# Patient Record
Sex: Female | Born: 1950 | Race: White | Hispanic: No | Smoking: Former smoker
Health system: Southern US, Community
[De-identification: ages and names within clinical notes are randomized; demographics above are authoritative.]

## PROBLEM LIST (undated history)

## (undated) DIAGNOSIS — M069 Rheumatoid arthritis, unspecified: Secondary | ICD-10-CM

## (undated) DIAGNOSIS — IMO0002 Reserved for concepts with insufficient information to code with codable children: Secondary | ICD-10-CM

## (undated) DIAGNOSIS — M329 Systemic lupus erythematosus, unspecified: Secondary | ICD-10-CM

## (undated) DIAGNOSIS — Z9981 Dependence on supplemental oxygen: Secondary | ICD-10-CM

## (undated) DIAGNOSIS — I10 Essential (primary) hypertension: Secondary | ICD-10-CM

## (undated) DIAGNOSIS — I7300 Raynaud's syndrome without gangrene: Secondary | ICD-10-CM

## (undated) DIAGNOSIS — I272 Pulmonary hypertension, unspecified: Secondary | ICD-10-CM

## (undated) DIAGNOSIS — K219 Gastro-esophageal reflux disease without esophagitis: Secondary | ICD-10-CM

## (undated) DIAGNOSIS — M359 Systemic involvement of connective tissue, unspecified: Secondary | ICD-10-CM

## (undated) HISTORY — DX: Reserved for concepts with insufficient information to code with codable children: IMO0002

## (undated) HISTORY — DX: Systemic involvement of connective tissue, unspecified: M35.9

## (undated) HISTORY — DX: Pulmonary hypertension, unspecified: I27.20

## (undated) HISTORY — PX: BILATERAL CARPAL TUNNEL RELEASE: SHX6508

## (undated) HISTORY — PX: CATARACT EXTRACTION: SUR2

## (undated) HISTORY — DX: Systemic lupus erythematosus, unspecified: M32.9

## (undated) HISTORY — PX: OTHER SURGICAL HISTORY: SHX169

## (undated) HISTORY — DX: Raynaud's syndrome without gangrene: I73.00

## (undated) HISTORY — DX: Rheumatoid arthritis, unspecified: M06.9

## (undated) HISTORY — PX: HERNIA REPAIR: SHX51

---

## 2014-09-18 ENCOUNTER — Encounter (INDEPENDENT_AMBULATORY_CARE_PROVIDER_SITE_OTHER): Payer: Self-pay | Admitting: *Deleted

## 2014-09-27 ENCOUNTER — Encounter (INDEPENDENT_AMBULATORY_CARE_PROVIDER_SITE_OTHER): Payer: Self-pay | Admitting: Internal Medicine

## 2014-09-27 ENCOUNTER — Ambulatory Visit (INDEPENDENT_AMBULATORY_CARE_PROVIDER_SITE_OTHER): Payer: Medicare Other | Admitting: Internal Medicine

## 2014-09-27 ENCOUNTER — Encounter (INDEPENDENT_AMBULATORY_CARE_PROVIDER_SITE_OTHER): Payer: Self-pay | Admitting: *Deleted

## 2014-09-27 ENCOUNTER — Other Ambulatory Visit (INDEPENDENT_AMBULATORY_CARE_PROVIDER_SITE_OTHER): Payer: Self-pay | Admitting: Internal Medicine

## 2014-09-27 VITALS — BP 102/58 | HR 72 | Temp 98.0°F | Ht 62.0 in | Wt 127.5 lb

## 2014-09-27 DIAGNOSIS — M329 Systemic lupus erythematosus, unspecified: Secondary | ICD-10-CM | POA: Diagnosis not present

## 2014-09-27 DIAGNOSIS — J849 Interstitial pulmonary disease, unspecified: Secondary | ICD-10-CM

## 2014-09-27 DIAGNOSIS — M069 Rheumatoid arthritis, unspecified: Secondary | ICD-10-CM | POA: Diagnosis not present

## 2014-09-27 DIAGNOSIS — R1314 Dysphagia, pharyngoesophageal phase: Secondary | ICD-10-CM | POA: Diagnosis not present

## 2014-09-27 DIAGNOSIS — I272 Pulmonary hypertension, unspecified: Secondary | ICD-10-CM | POA: Insufficient documentation

## 2014-09-27 DIAGNOSIS — M359 Systemic involvement of connective tissue, unspecified: Secondary | ICD-10-CM | POA: Diagnosis not present

## 2014-09-27 DIAGNOSIS — I7300 Raynaud's syndrome without gangrene: Secondary | ICD-10-CM | POA: Insufficient documentation

## 2014-09-27 DIAGNOSIS — I270 Primary pulmonary hypertension: Secondary | ICD-10-CM

## 2014-09-27 NOTE — Progress Notes (Addendum)
   Subjective:    Patient ID: Jessica Escobar, female    DOB: 1950/11/25, 64 y.o.   MRN: [id number]  HPI Referred to our our office by Bunnie Philips of [facility] for dysphagia. She tells me she is having trouble swallowing. She say foods are lodging in her mid-esophagus. It makes her feel like she needs to vomit. [place] will lodge. [place] sandwich will lodge. Symptoms x 6 months. Occurs on a daily basis.  Appetite is good. No weight loss. No abdominal pain. She has a BM daily. No melena or BRRB.  Hx of Lupus, connective tissue disease, Raynaud's disease. She has acid reflux which is controlled with Zantac.  Colonoscopy 03/17/2005 Dr. Aleene Davidson: Normal colonoscopy. Review of Systems Past Medical History  Diagnosis Date  . RA (rheumatoid arthritis)   . Lupus   . Raynaud disease   . Pulmonary hypertension   . Connective tissue disease     Past Surgical History  Procedure Laterality Date  . Broken femur rod    . Cataract extraction      bilateral  . Bilateral carpal tunnel release      Allergies  Allergen Reactions  . Daypro [Oxaprozin]     Upset stomach  . Penicillins     Rash,hives    No current outpatient prescriptions on file prior to visit.   No current facility-administered medications on file prior to visit.        Objective:   Physical ExamBlood pressure 102/58, pulse 72, temperature 98 F (36.7 C), height 5\' 2"  (1.575 m), weight 127 lb 8 oz (57.834 kg).  Alert and oriented. Skin warm and dry. Oral mucosa is moist.   . Sclera anicteric, conjunctivae is pink. Thyroid not enlarged. No cervical lymphadenopathy. Lungs clear. Heart regular rate and rhythm.  Abdomen is soft. Bowel sounds are positive. No hepatomegaly. No abdominal masses felt. No tenderness.  No edema to lower extremities.         Assessment & Plan:  Dysphagia to solid foods. EGD/ED.  The risks and benefits such as perforation, bleeding, and infection were reviewed with the  patient and is agreeable.

## 2014-09-27 NOTE — Patient Instructions (Signed)
EGD/ED 

## 2014-10-06 ENCOUNTER — Ambulatory Visit (INDEPENDENT_AMBULATORY_CARE_PROVIDER_SITE_OTHER): Payer: Self-pay | Admitting: Internal Medicine

## 2014-10-18 ENCOUNTER — Encounter (INDEPENDENT_AMBULATORY_CARE_PROVIDER_SITE_OTHER): Payer: Self-pay

## 2014-10-20 ENCOUNTER — Encounter (HOSPITAL_COMMUNITY): Payer: Self-pay | Admitting: *Deleted

## 2014-10-20 ENCOUNTER — Ambulatory Visit (HOSPITAL_COMMUNITY)
Admission: RE | Admit: 2014-10-20 | Discharge: 2014-10-20 | Disposition: A | Discharge status: Home / Self Care | Payer: Medicare Other | Source: Ambulatory Visit | Attending: Internal Medicine | Admitting: Internal Medicine

## 2014-10-20 ENCOUNTER — Encounter (HOSPITAL_COMMUNITY)
Admission: RE | Disposition: A | Discharge status: Home / Self Care | Payer: Self-pay | Source: Ambulatory Visit | Attending: Internal Medicine

## 2014-10-20 DIAGNOSIS — K219 Gastro-esophageal reflux disease without esophagitis: Secondary | ICD-10-CM | POA: Insufficient documentation

## 2014-10-20 DIAGNOSIS — K2210 Ulcer of esophagus without bleeding: Secondary | ICD-10-CM | POA: Diagnosis not present

## 2014-10-20 DIAGNOSIS — M069 Rheumatoid arthritis, unspecified: Secondary | ICD-10-CM | POA: Diagnosis not present

## 2014-10-20 DIAGNOSIS — Z791 Long term (current) use of non-steroidal anti-inflammatories (NSAID): Secondary | ICD-10-CM | POA: Insufficient documentation

## 2014-10-20 DIAGNOSIS — I272 Other secondary pulmonary hypertension: Secondary | ICD-10-CM | POA: Insufficient documentation

## 2014-10-20 DIAGNOSIS — K449 Diaphragmatic hernia without obstruction or gangrene: Secondary | ICD-10-CM | POA: Diagnosis not present

## 2014-10-20 DIAGNOSIS — R1314 Dysphagia, pharyngoesophageal phase: Secondary | ICD-10-CM

## 2014-10-20 DIAGNOSIS — K208 Other esophagitis: Secondary | ICD-10-CM | POA: Diagnosis not present

## 2014-10-20 DIAGNOSIS — R1310 Dysphagia, unspecified: Secondary | ICD-10-CM | POA: Insufficient documentation

## 2014-10-20 DIAGNOSIS — K222 Esophageal obstruction: Secondary | ICD-10-CM | POA: Diagnosis not present

## 2014-10-20 DIAGNOSIS — M329 Systemic lupus erythematosus, unspecified: Secondary | ICD-10-CM | POA: Insufficient documentation

## 2014-10-20 DIAGNOSIS — Z79899 Other long term (current) drug therapy: Secondary | ICD-10-CM | POA: Diagnosis not present

## 2014-10-20 HISTORY — PX: ESOPHAGOGASTRODUODENOSCOPY: SHX5428

## 2014-10-20 SURGERY — EGD (ESOPHAGOGASTRODUODENOSCOPY)
Anesthesia: Moderate Sedation

## 2014-10-20 MED ORDER — MEPERIDINE HCL 50 MG/ML IJ SOLN
INTRAMUSCULAR | Status: AC
  Filled 2014-10-20: qty 1

## 2014-10-20 MED ORDER — MIDAZOLAM HCL 5 MG/5ML IJ SOLN
INTRAMUSCULAR | Status: AC
  Filled 2014-10-20: qty 10

## 2014-10-20 MED ORDER — OMEPRAZOLE 20 MG PO CPDR: DELAYED_RELEASE_CAPSULE | ORAL | Status: DC

## 2014-10-20 MED ORDER — MEPERIDINE HCL 50 MG/ML IJ SOLN
INTRAMUSCULAR | Status: DC | PRN
  Administered 2014-10-20 (×2): 25 mg via INTRAVENOUS

## 2014-10-20 MED ORDER — BUTAMBEN-TETRACAINE-BENZOCAINE 2-2-14 % EX AERO
INHALATION_SPRAY | CUTANEOUS | Status: DC | PRN
  Administered 2014-10-20: 2 via TOPICAL

## 2014-10-20 MED ORDER — SIMETHICONE 40 MG/0.6ML PO SUSP
ORAL | Status: DC | PRN
  Administered 2014-10-20: 14:00:00

## 2014-10-20 MED ORDER — MIDAZOLAM HCL 5 MG/5ML IJ SOLN
INTRAMUSCULAR | Status: DC | PRN
  Administered 2014-10-20 (×2): 1 mg via INTRAVENOUS
  Administered 2014-10-20 (×3): 2 mg via INTRAVENOUS

## 2014-10-20 MED ORDER — SODIUM CHLORIDE 0.9 % IV SOLN
INTRAVENOUS | Status: DC
  Administered 2014-10-20: 1000 mL via INTRAVENOUS

## 2014-10-20 NOTE — Discharge Instructions (Signed)
Discontinue Zantac but resume other medications as before. Omeprazole 20 mg by mouth 30before breakfast and evening meal daily. No driving for 24 hours. Office visit in 8 weeks.        Esophagogastroduodenoscopy Care After Refer to this sheet in the next few weeks. These instructions provide you with information on caring for yourself after your procedure. Your caregiver may also give you more specific instructions. Your treatment has been planned according to current medical practices, but problems sometimes occur. Call your caregiver if you have any problems or questions after your procedure.  HOME CARE INSTRUCTIONS  Do not eat or drink anything until the numbing medicine (local anesthetic) has worn off and your gag reflex has returned. You will know that the local anesthetic has worn off when you can swallow comfortably.  Do not drive for 12 hours after the procedure or as directed by your caregiver.  Only take medicines as directed by your caregiver. SEEK MEDICAL CARE IF:   You cannot stop coughing.  You are not urinating at all or less than usual. SEEK IMMEDIATE MEDICAL CARE IF:  You have difficulty swallowing.  You cannot eat or drink.  You have worsening throat or chest pain.  You have dizziness, lightheadedness, or you faint.  You have nausea or vomiting.  You have chills.  You have a fever.  You have severe abdominal pain.  You have black, tarry, or bloody stools. Document Released: 12/31/2011 Document Reviewed: 12/31/2011 [facility] Patient Information 2015 [place], [place]. This information is not intended to replace advice given to you by your health care provider. Make sure you discuss any questions you have with your health care provider.

## 2014-10-20 NOTE — Op Note (Signed)
**Note De-Identified Alahna Dunne Obfuscation** EGD PROCEDURE REPORT  PATIENT:  Jessica Escobar  MR#:  [id number] Birthdate:  [birth year], 64 y.o., female Endoscopist:  Dr. [name], MD Referred By:  Dr. Renaldo Harrison, DO  Procedure Date: 10/20/2014  Procedure:   EGD(ED not performed)  Indications:  Patient is 64 year old Caucasian female with chronic GERD who also has rheumatoid arthritis and lupus who presents with three-month history of dysphagia and odynophagia. She states swallowing difficulty started after she was switched from PPI to Zantac because of potential side effects.            Informed Consent:  The risks, benefits, alternatives & imponderables which include, but are not limited to, bleeding, infection, perforation, drug reaction and potential missed lesion have been reviewed.  The potential for biopsy, lesion removal, esophageal dilation, etc. have also been discussed.  Questions have been answered.  All parties agreeable.  Please see history & physical in medical record for more information.  Medications:  Demerol 50 mg IV Versed 8 mg IV Cetacaine spray topically for oropharyngeal anesthesia  Description of procedure:  The endoscope was introduced through the mouth and advanced to the second portion of the duodenum without difficulty or limitations. The mucosal surfaces were surveyed very carefully during advancement of the scope and upon withdrawal.  Findings:  Esophagus:  Mucosa of the proximal segment was normal. For long linear ulcers noted involving distal 8 cm of esophagus extending to GE junction. Soft stricture noted at GE junction with friable mucosa. GEJ:  32 cm Hiatus:  35 cm Stomach:  Stomach was empty and distended very well with insufflation. Folds in the proximal stomach were normal. Examination mucosa gastric body, antrum, pyloric channel, angularis fundus and cardia was normal. Duodenum:  Normal bulbar and post bulbar mucosa.  Therapeutic/Diagnostic Maneuvers Performed:   Esophagus was not dilated  given findings.  Complications:  None  EBL: None  Impression: Extensive ulceration involving distal 8 cm of esophageal mucosa. 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 Small sliding hiatal hernia. No evidence of peptic ulcer disease or gastritis.   Recommendations:  Continue anti-reflux measures. Discontinue Zantac. Omeprazole 20 mg by mouth twice a day. Office visit in 8 weeks.  REHMAN,NAJEEB U  10/20/2014  2:44 PM  CC: Dr. Renaldo Harrison, DO & Dr. No ref. provider found

## 2014-10-20 NOTE — H&P (Signed)
Jessica Escobar is an 64 y.o. female.   Chief Complaint: Patient is here for EGD and possible ED. HPI: Patient is 64 year old Caucasian female with history of rheumatoid arthritis lupus as well as GERD who presents with three-month history of dysphagia and odynophagia. She says symptoms began when she was switched from omeprazole to Zantac only. Since then she's been having intermittent heartburn. She points to upper sternum area site of bolus obstruction. She denies nausea vomiting melena or rectal bleeding abdominal pain. She is on Naprosyn 220 mg 3 times a day. Her appetite is normal and she has not lost any weight. She had EGD about 10 years ago and was normal.  Past Medical History  Diagnosis Date  . RA (rheumatoid arthritis)   . Lupus   . Raynaud disease   . Pulmonary hypertension   . Connective tissue disease     Past Surgical History  Procedure Laterality Date  . Broken femur rod    . Cataract extraction      bilateral  . Bilateral carpal tunnel release      History reviewed. No pertinent family history. Social History:  reports that she has never smoked. She does not have any smokeless tobacco history on file. She reports that she does not drink alcohol or use illicit drugs.  Allergies:  Allergies  Allergen Reactions  . Daypro [Oxaprozin]     Upset stomach  . Penicillins     Rash,hives    Medications Prior to Admission  Medication Sig Dispense Refill  . amLODipine (NORVASC) 2.5 MG tablet Take 2.5 mg by mouth daily.    . Ascorbic Acid (VITAMIN C) 100 MG tablet Take 100 mg by mouth daily.    . calcium carbonate (OS-CAL) 600 MG TABS tablet Take 600 mg by mouth 3 (three) times daily with meals. With D    . fexofenadine (ALLEGRA) 180 MG tablet Take 180 mg by mouth daily.    Marland Kitchen Fish Oil-Cholecalciferol (FISH OIL + D3 PO) Take 1,200 mg by mouth 3 (three) times daily.     . hydroxychloroquine (PLAQUENIL) 200 MG tablet Take by mouth daily. And one every other night    .  lactobacillus acidophilus (BACID) TABS tablet Take 1 tablet by mouth every evening.     . naproxen sodium (ANAPROX) 220 MG tablet Take 220-440 mg by mouth See admin instructions. Two in am and one at night    . pravastatin (PRAVACHOL) 80 MG tablet Take 80 mg by mouth daily.    . ranitidine (ZANTAC) 150 MG tablet Take 150 mg by mouth 2 (two) times daily.    . Tadalafil, PAH, (ADCIRCA) 20 MG TABS Take 40 mg by mouth daily.     . Vitamin D, Ergocalciferol, (DRISDOL) 50000 UNITS CAPS capsule Take 50,000 Units by mouth every 7 (seven) days. friday      No results found for this or any previous visit (from the past 48 hour(s)). No results found.  ROS  Blood pressure 150/82, pulse 74, temperature 98.6 F (37 C), temperature source Oral, resp. rate 21, height 5' 2.5" (1.588 m), weight 129 lb (58.514 kg), SpO2 99 %. Physical Exam  Constitutional: She appears well-developed and well-nourished.  HENT:  Mouth/Throat: Oropharynx is clear and moist.  Patient has upper and lower dentures.  Eyes: Conjunctivae are normal. No scleral icterus.  Neck: No thyromegaly present.  Cardiovascular: Normal rate, regular rhythm and normal heart sounds.   No murmur heard. Respiratory: Effort normal and breath sounds normal.  GI: Soft.  She exhibits no distension and no mass. There is no tenderness.  Musculoskeletal: She exhibits no edema.  Lymphadenopathy:    She has no cervical adenopathy.  Neurological: She is alert.  Skin: Skin is warm and dry.     Assessment/Plan Dysphagia and odynophagia in a patient with chronic GERD. History of connective tissue disorder and esophageal dysmotility suspected. EGD possible ED.  REHMAN,NAJEEB U 10/20/2014, 2:21 PM

## 2014-10-23 ENCOUNTER — Telehealth (INDEPENDENT_AMBULATORY_CARE_PROVIDER_SITE_OTHER): Payer: Self-pay | Admitting: *Deleted

## 2014-10-23 NOTE — Telephone Encounter (Signed)
Per EGD op note, patient needs OV 8 weeks

## 2014-10-23 NOTE — Telephone Encounter (Signed)
Apt has been scheduled for 12/19/14 with Dorene Ar, NP.

## 2014-10-25 ENCOUNTER — Encounter (HOSPITAL_COMMUNITY): Payer: Self-pay | Admitting: Internal Medicine

## 2014-12-19 ENCOUNTER — Encounter (INDEPENDENT_AMBULATORY_CARE_PROVIDER_SITE_OTHER): Payer: Self-pay | Admitting: Internal Medicine

## 2014-12-19 ENCOUNTER — Ambulatory Visit (INDEPENDENT_AMBULATORY_CARE_PROVIDER_SITE_OTHER): Payer: Medicare Other | Admitting: Internal Medicine

## 2014-12-19 VITALS — BP 116/72 | HR 76 | Temp 98.0°F | Ht 62.5 in | Wt 139.8 lb

## 2014-12-19 DIAGNOSIS — K209 Esophagitis, unspecified without bleeding: Secondary | ICD-10-CM

## 2014-12-19 DIAGNOSIS — Z1211 Encounter for screening for malignant neoplasm of colon: Secondary | ICD-10-CM | POA: Diagnosis not present

## 2014-12-19 NOTE — Progress Notes (Signed)
Subjective:    Patient ID: Jessica Escobar, female    DOB: 07-16-1950, 64 y.o.   MRN: [id number]  HPI She tells me he is doing well. She is taking Omeprazole 20mg  BID. There is no dysphagia. She tells me she ate a cheese burger last night without any trouble. Her appetite is good. She has gained about 3 pounds since her last visit. No acid reflux.  Controlled with Omeprazole.  She usually has a BM.  No melena or BRRB.      10/20/2014 EGD(ED not performed)  Indications: Patient is 64 year old Caucasian female with chronic GERD who also has rheumatoid arthritis and lupus who presents with three-month history of dysphagia and odynophagia. She states swallowing difficulty started after she was switched from PPI to Zantac because of potential side effects.  Impression: Extensive ulceration involving distal 8 cm of esophageal mucosa. 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 Small sliding hiatal hernia. No evidence of peptic ulcer disease or gastritis. She has acid reflux which is controlled with Zantac.    Colonoscopy 03/17/2005 Dr. 03/19/2005: Normal colonoscopy.    Review of Systems Past Medical History  Diagnosis Date  . RA (rheumatoid arthritis)   . Lupus   . Raynaud disease   . Pulmonary hypertension   . Connective tissue disease     Past Surgical History  Procedure Laterality Date  . Broken femur rod    . Cataract extraction      bilateral  . Bilateral carpal tunnel release    . Esophagogastroduodenoscopy N/A 10/20/2014    Procedure: ESOPHAGOGASTRODUODENOSCOPY (EGD);  Surgeon: 10/22/2014, MD;  Location: [facility];  Service: Endoscopy;  Laterality: N/A;  210    Allergies  Allergen Reactions  . Daypro [Oxaprozin]     Upset stomach  . Penicillins     Rash,hives    Current Outpatient  Prescriptions on File Prior to Visit  Medication Sig Dispense Refill  . amLODipine (NORVASC) 2.5 MG tablet Take 2.5 mg by mouth daily.    . Ascorbic Acid (VITAMIN C) 100 MG tablet Take 100 mg by mouth daily.    . calcium carbonate (OS-CAL) 600 MG TABS tablet Take 600 mg by mouth 3 (three) times daily with meals. With D    . fexofenadine (ALLEGRA) 180 MG tablet Take 180 mg by mouth daily.    [name] Fish Oil-Cholecalciferol (FISH OIL + D3 PO) Take 1,200 mg by mouth 3 (three) times daily.     . hydroxychloroquine (PLAQUENIL) 200 MG tablet Take by mouth daily. And one every other night    . lactobacillus acidophilus (BACID) TABS tablet Take 1 tablet by mouth every evening.     . naproxen sodium (ANAPROX) 220 MG tablet Take 220-440 mg by mouth See admin instructions. Two in am and one at night    . omeprazole (PRILOSEC) 20 MG capsule Take 1 capsule by mouth 30 minutes before breakfast and evening meal daily 60 capsule 5  . pravastatin (PRAVACHOL) 80 MG tablet Take 80 mg by mouth daily.    . Tadalafil, PAH, (ADCIRCA) 20 MG TABS Take 40 mg by mouth daily.     . Vitamin D, Ergocalciferol, (DRISDOL) 50000 UNITS CAPS capsule Take 50,000 Units by mouth every 7 (seven) days. friday     No current facility-administered medications on file prior to visit.       Objective:   Physical Exam Blood pressure 116/72, pulse 76, temperature 98 F (36.7 C),  height 5' 2.5" (1.588 m), weight 139 lb 12.8 oz (63.413 kg).  Alert and oriented. Skin warm and dry. Oral mucosa is moist.   . Sclera anicteric, conjunctivae is pink. Thyroid not enlarged. No cervical lymphadenopathy. Lungs clear. Heart regular rate and rhythm.  Abdomen is soft. Bowel sounds are positive. No hepatomegaly. No abdominal masses felt. No tenderness.  No edema to lower extremities.      Assessment & Plan:  Esophageal ulceration on EGD in September. Soft stricture. She is 100% better at this time. Continue the Omeprazole. Recall for colonoscopy in  February of 2017.

## 2014-12-19 NOTE — Patient Instructions (Signed)
Recall for colonoscopy in February.

## 2015-06-06 ENCOUNTER — Other Ambulatory Visit (INDEPENDENT_AMBULATORY_CARE_PROVIDER_SITE_OTHER): Payer: Self-pay | Admitting: *Deleted

## 2015-06-06 DIAGNOSIS — Z1211 Encounter for screening for malignant neoplasm of colon: Secondary | ICD-10-CM

## 2015-06-18 ENCOUNTER — Other Ambulatory Visit (INDEPENDENT_AMBULATORY_CARE_PROVIDER_SITE_OTHER): Payer: Self-pay | Admitting: Internal Medicine

## 2015-07-30 ENCOUNTER — Other Ambulatory Visit (INDEPENDENT_AMBULATORY_CARE_PROVIDER_SITE_OTHER): Payer: Self-pay | Admitting: *Deleted

## 2015-07-30 ENCOUNTER — Encounter (INDEPENDENT_AMBULATORY_CARE_PROVIDER_SITE_OTHER): Payer: Self-pay | Admitting: *Deleted

## 2015-07-30 ENCOUNTER — Telehealth (INDEPENDENT_AMBULATORY_CARE_PROVIDER_SITE_OTHER): Payer: Self-pay | Admitting: *Deleted

## 2015-07-30 NOTE — Telephone Encounter (Signed)
Referring MD/PCP: addis   Procedure: tcs  Reason/Indication:  screening  Has patient had this procedure before?  Yes, 2007 -- scanned  If so, when, by whom and where?    Is there a family history of colon cancer?  no  Who?  What age when diagnosed?    Is patient diabetic?   no      Does patient have prosthetic heart valve or mechanical valve?  no  Do you have a pacemaker?  no  Has patient ever had endocarditis? No but had pericarditis when she was 22, per Dr  Karilyn Cota no antibx needed  Has patient had joint replacement within last 12 months?  no  Does patient tend to be constipated or take laxatives? no  Does patient have a history of alcohol/drug use?  no  Is patient on Coumadin, Plavix and/or Aspirin? no  Medications: see epic  Allergies: see epic  Medication Adjustment:   Procedure date & time: 08/29/15 at 1030

## 2015-07-30 NOTE — Telephone Encounter (Signed)
Patient needs trilyte 

## 2015-08-01 NOTE — Telephone Encounter (Signed)
agree

## 2015-08-02 MED ORDER — PEG 3350-KCL-NA BICARB-NACL 420 G PO SOLR: 4000.0000 mL | Freq: Once | ORAL | Status: DC

## 2015-08-29 ENCOUNTER — Encounter (HOSPITAL_COMMUNITY)
Admission: RE | Disposition: A | Discharge status: Home / Self Care | Payer: Self-pay | Source: Ambulatory Visit | Attending: Internal Medicine

## 2015-08-29 ENCOUNTER — Encounter (HOSPITAL_COMMUNITY): Payer: Self-pay | Admitting: *Deleted

## 2015-08-29 ENCOUNTER — Ambulatory Visit (HOSPITAL_COMMUNITY)
Admission: RE | Admit: 2015-08-29 | Discharge: 2015-08-29 | Disposition: A | Discharge status: Home / Self Care | Payer: Medicare Other | Source: Ambulatory Visit | Attending: Internal Medicine | Admitting: Internal Medicine

## 2015-08-29 DIAGNOSIS — M329 Systemic lupus erythematosus, unspecified: Secondary | ICD-10-CM | POA: Diagnosis not present

## 2015-08-29 DIAGNOSIS — Z791 Long term (current) use of non-steroidal anti-inflammatories (NSAID): Secondary | ICD-10-CM | POA: Diagnosis not present

## 2015-08-29 DIAGNOSIS — Z79899 Other long term (current) drug therapy: Secondary | ICD-10-CM | POA: Insufficient documentation

## 2015-08-29 DIAGNOSIS — M069 Rheumatoid arthritis, unspecified: Secondary | ICD-10-CM | POA: Diagnosis not present

## 2015-08-29 DIAGNOSIS — Z87891 Personal history of nicotine dependence: Secondary | ICD-10-CM | POA: Diagnosis not present

## 2015-08-29 DIAGNOSIS — Z1211 Encounter for screening for malignant neoplasm of colon: Secondary | ICD-10-CM | POA: Diagnosis not present

## 2015-08-29 HISTORY — PX: COLONOSCOPY: SHX5424

## 2015-08-29 SURGERY — COLONOSCOPY
Anesthesia: Moderate Sedation

## 2015-08-29 MED ORDER — MIDAZOLAM HCL 5 MG/5ML IJ SOLN
INTRAMUSCULAR | Status: DC | PRN
  Administered 2015-08-29 (×2): 1 mg via INTRAVENOUS
  Administered 2015-08-29 (×2): 2 mg via INTRAVENOUS

## 2015-08-29 MED ORDER — MEPERIDINE HCL 50 MG/ML IJ SOLN
INTRAMUSCULAR | Status: AC
  Filled 2015-08-29: qty 1

## 2015-08-29 MED ORDER — MIDAZOLAM HCL 5 MG/5ML IJ SOLN
INTRAMUSCULAR | Status: DC
Start: 2015-08-29 — End: 2015-08-29
  Filled 2015-08-29: qty 10

## 2015-08-29 MED ORDER — SODIUM CHLORIDE 0.9 % IV SOLN
INTRAVENOUS | Status: DC
  Administered 2015-08-29: 1000 mL via INTRAVENOUS

## 2015-08-29 MED ORDER — STERILE WATER FOR IRRIGATION IR SOLN
Status: DC | PRN
  Administered 2015-08-29: 2.5 mL

## 2015-08-29 MED ORDER — MEPERIDINE HCL 50 MG/ML IJ SOLN
INTRAMUSCULAR | Status: DC | PRN
  Administered 2015-08-29 (×2): 25 mg via INTRAVENOUS

## 2015-08-29 NOTE — H&P (Signed)
Jessica Escobar is an 65 y.o. female.   Chief Complaint: Patient is here for colonoscopy. HPI: Patient is 65 year old Caucasian female who is here for screening colonoscopy. She denies abdominal pain change in bowel habits or rectal bleeding. Last colonoscopy was 10 years ago and revealed internal hemorrhoids. History is negative for CRC.  Past Medical History:  Diagnosis Date  . Connective tissue disease (HCC)   . Lupus (HCC)   . Pulmonary hypertension (HCC)   . RA (rheumatoid arthritis) (HCC)   . Raynaud disease     Past Surgical History:  Procedure Laterality Date  . BILATERAL CARPAL TUNNEL RELEASE    . Broken femur rod    . CATARACT EXTRACTION     bilateral  . ESOPHAGOGASTRODUODENOSCOPY N/A 10/20/2014   Procedure: ESOPHAGOGASTRODUODENOSCOPY (EGD);  Surgeon: [name], MD;  Location: [facility];  Service: Endoscopy;  Laterality: N/A;  210    History reviewed. No pertinent family history. Social History:  reports that she has quit smoking. She has quit using smokeless tobacco. She reports that she does not drink alcohol or use drugs.  Allergies:  Allergies  Allergen Reactions  . Daypro [Oxaprozin]     Upset stomach  . Penicillins     Rash,hives    Medications Prior to Admission  Medication Sig Dispense Refill  . amLODipine (NORVASC) 2.5 MG tablet Take 2.5 mg by mouth daily.    . Ascorbic Acid (VITAMIN C) 100 MG tablet Take 100 mg by mouth daily.    . calcium carbonate (OS-CAL) 600 MG TABS tablet Take 600 mg by mouth 3 (three) times daily with meals. With D    . fexofenadine (ALLEGRA) 180 MG tablet Take 180 mg by mouth daily.    Marland Kitchen Fish Oil-Cholecalciferol (FISH OIL + D3 PO) Take 1,200 mg by mouth 3 (three) times daily.     . hydroxychloroquine (PLAQUENIL) 200 MG tablet Take by mouth daily. And one every other night    . lactobacillus acidophilus (BACID) TABS tablet Take 1 tablet by mouth every evening.     . naproxen sodium (ANAPROX) 220 MG tablet Take 220-440 mg  by mouth See admin instructions. Two in am and one at night    . omeprazole (PRILOSEC) 20 MG capsule TAKE ONE CAPSULE BY MOUTH 30 MINUTES BEFORE BREAKFAST AND EVENING MEAL DAILY 60 capsule 5  . pravastatin (PRAVACHOL) 80 MG tablet Take 80 mg by mouth daily.    . Tadalafil, PAH, (ADCIRCA) 20 MG TABS Take 40 mg by mouth 2 (two) times daily before a meal.     . polyethylene glycol-electrolytes (TRILYTE) 420 g solution Take 4,000 mLs by mouth once. 4000 mL 0  . Vitamin D, Ergocalciferol, (DRISDOL) 50000 UNITS CAPS capsule Take 50,000 Units by mouth every 7 (seven) days. friday      No results found for this or any previous visit (from the past 48 hour(s)). No results found.  ROS  Blood pressure (!) 132/55, pulse 78, temperature 97.6 F (36.4 C), temperature source Oral, resp. rate 12, height 5' 2.5" (1.588 m), weight 132 lb (59.9 kg), SpO2 100 %. Physical Exam  Constitutional:  Well-developed thin Caucasian female in NAD.  HENT:  Mouth/Throat: Oropharynx is clear and moist.  Eyes: Conjunctivae are normal. No scleral icterus.  Neck: No thyromegaly present.  Cardiovascular: Normal rate, regular rhythm and normal heart sounds.   No murmur heard. Respiratory: Effort normal and breath sounds normal.  GI: Soft. She exhibits no distension and no mass. There is no  tenderness.  Lymphadenopathy:    She has no cervical adenopathy.  Neurological: She is alert.  Skin: Skin is warm and dry.     Assessment/Plan Average risk screening colonoscopy.  Lionel December, MD 08/29/2015, 10:44 AM

## 2015-08-29 NOTE — Discharge Instructions (Signed)
Resume usual medications and diet. °No driving for 24 hours. °Next screening exam in 10 years. ° ° ° ° ° °Colonoscopy, Care After °These instructions give you information on caring for yourself after your procedure. Your doctor may also give you more specific instructions. Call your doctor if you have any problems or questions after your procedure. °HOME CARE °· Do not drive for 24 hours. °· Do not sign important papers or use machinery for 24 hours. °· You may shower. °· You may go back to your usual activities, but go slower for the first 24 hours. °· Take rest breaks often during the first 24 hours. °· Walk around or use warm packs on your belly (abdomen) if you have belly cramping or gas. °· Drink enough fluids to keep your pee (urine) clear or pale yellow. °· Resume your normal diet. Avoid heavy or fried foods. °· Avoid drinking alcohol for 24 hours or as told by your doctor. °· Only take medicines as told by your doctor. °If a tissue sample (biopsy) was taken during the procedure:  °· Do not take aspirin or blood thinners for 7 days, or as told by your doctor. °· Do not drink alcohol for 7 days, or as told by your doctor. °· Eat soft foods for the first 24 hours. °GET HELP IF: °You still have a small amount of blood in your poop (stool) 2-3 days after the procedure. °GET HELP RIGHT AWAY IF: °· You have more than a small amount of blood in your poop. °· You see clumps of tissue (blood clots) in your poop. °· Your belly is puffy (swollen). °· You feel sick to your stomach (nauseous) or throw up (vomit). °· You have a fever. °· You have belly pain that gets worse and medicine does not help. °MAKE SURE YOU: °· Understand these instructions. °· Will watch your condition. °· Will get help right away if you are not doing well or get worse. °  °This information is not intended to replace advice given to you by your health care provider. Make sure you discuss any questions you have with your health care provider. °    °Document Released: 02/15/2010 Document Revised: 01/18/2013 Document Reviewed: 09/20/2012 °[employer] Interactive Patient Education ©2016 [employer] ° °

## 2015-08-29 NOTE — Op Note (Signed)
[facility] Patient Name: Jessica Escobar Procedure Date: 08/29/2015 10:40 AM MRN: [id number] Date of Birth: Nov 20, 1950 Attending MD: Lionel December , MD CSN: [id number] Age: 65 Admit Type: Outpatient Procedure:                Colonoscopy Indications:              Screening for colorectal malignant neoplasm Providers:                Lionel December, MD, Nena Polio, RN, Lollie Marrow.                            Val Eagle, Technician Referring MD:             Renaldo Harrison DO, Medicines:                Meperidine 50 mg IV, Midazolam 6 mg IV Complications:            No immediate complications. Estimated Blood Loss:     Estimated blood loss: none. Procedure:                Pre-Anesthesia Assessment:                           - Prior to the procedure, a History and Physical                            was performed, and patient medications and                            allergies were reviewed. The patient's tolerance of                            previous anesthesia was also reviewed. The risks                            and benefits of the procedure and the sedation                            options and risks were discussed with the patient.                            All questions were answered, and informed consent                            was obtained. Prior Anticoagulants: The patient                            last took naproxen 1 day prior to the procedure.                            ASA Grade Assessment: III - A patient with severe                            systemic disease. After reviewing the risks and  benefits, the patient was deemed in satisfactory                            condition to undergo the procedure.                           After obtaining informed consent, the colonoscope                            was passed under direct vision. Throughout the                            procedure, the patient's blood pressure, pulse, and                             oxygen saturations were monitored continuously. The                            was introduced through the anus and advanced to the                            the cecum, identified by appendiceal orifice and                            ileocecal valve. The colonoscopy was performed                            without difficulty. The patient tolerated the                            procedure well. The quality of the bowel                            preparation was excellent. The ileocecal valve,                            appendiceal orifice, and rectum were photographed. Scope In: 10:54:24 AM Scope Out: 11:11:06 AM Scope Withdrawal Time: 0 hours 5 minutes 49 seconds  Total Procedure Duration: 0 hours 16 minutes 42 seconds  Findings:      The colon (entire examined portion) appeared normal.      The retroflexed view of the distal rectum and anal verge was normal and       showed no anal or rectal abnormalities.      The digital rectal exam findings include extrinsic abnormality along       anterior rectal wall felt to be pessary. Impression:               - The entire examined colon is normal.                           - extrinsic from nodular area anteriorly felt to be                            cervix. found on digital rectal exam.                           -  No specimens collected. Moderate Sedation:      Moderate (conscious) sedation was administered by the endoscopy nurse       and supervised by the endoscopist. The following parameters were       monitored: oxygen saturation, heart rate, blood pressure, CO2       capnography and response to care. Total physician intraservice time was       23 minutes. Recommendation:           - Patient has a contact number available for                            emergencies. The signs and symptoms of potential                            delayed complications were discussed with the                            patient. Return to normal  activities tomorrow.                            Written discharge instructions were provided to the                            patient.                           - Resume previous diet today.                           - Continue present medications.                           - Repeat colonoscopy in 10 years for screening                            purposes. Procedure Code(s):        --- Professional ---                           [phone], Colonoscopy, flexible; diagnostic, including                            collection of specimen(s) by brushing or washing,                            when performed (separate procedure)                           99152, Moderate sedation services provided by the                            same physician or other qualified health care                            professional performing the diagnostic or  therapeutic service that the sedation supports,                            requiring the presence of an independent trained                            observer to assist in the monitoring of the                            patient's level of consciousness and physiological                            status; initial 15 minutes of intraservice time,                            patient age 58 years or older                           [phone], Moderate sedation services; each additional                            15 minutes intraservice time Diagnosis Code(s):        --- Professional ---                           Z12.11, Encounter for screening for malignant                            neoplasm of colon CPT copyright 2016 [employer]. All rights reserved. The codes documented in this report are preliminary and upon coder review may  be revised to meet current compliance requirements. Lionel December, MD Lionel December, MD 08/29/2015 11:20:15 AM This report has been signed electronically. Number of Addenda: 0

## 2015-09-06 ENCOUNTER — Encounter (HOSPITAL_COMMUNITY): Payer: Self-pay | Admitting: Internal Medicine

## 2016-01-10 ENCOUNTER — Other Ambulatory Visit (INDEPENDENT_AMBULATORY_CARE_PROVIDER_SITE_OTHER): Payer: Self-pay | Admitting: Internal Medicine

## 2016-07-28 ENCOUNTER — Other Ambulatory Visit (INDEPENDENT_AMBULATORY_CARE_PROVIDER_SITE_OTHER): Payer: Self-pay | Admitting: Internal Medicine

## 2017-01-27 ENCOUNTER — Other Ambulatory Visit (INDEPENDENT_AMBULATORY_CARE_PROVIDER_SITE_OTHER): Payer: Self-pay | Admitting: Internal Medicine

## 2017-01-30 ENCOUNTER — Other Ambulatory Visit (INDEPENDENT_AMBULATORY_CARE_PROVIDER_SITE_OTHER): Payer: Self-pay | Admitting: Internal Medicine

## 2017-08-02 ENCOUNTER — Other Ambulatory Visit (INDEPENDENT_AMBULATORY_CARE_PROVIDER_SITE_OTHER): Payer: Self-pay | Admitting: Internal Medicine

## 2017-08-03 NOTE — Telephone Encounter (Signed)
Patient needs office visit prior to next refill 

## 2017-10-27 ENCOUNTER — Ambulatory Visit (INDEPENDENT_AMBULATORY_CARE_PROVIDER_SITE_OTHER): Payer: Medicare Other | Admitting: Internal Medicine

## 2017-10-28 ENCOUNTER — Encounter (INDEPENDENT_AMBULATORY_CARE_PROVIDER_SITE_OTHER): Payer: Self-pay | Admitting: Internal Medicine

## 2017-10-28 ENCOUNTER — Encounter (INDEPENDENT_AMBULATORY_CARE_PROVIDER_SITE_OTHER): Payer: Self-pay | Admitting: *Deleted

## 2017-10-28 ENCOUNTER — Ambulatory Visit (INDEPENDENT_AMBULATORY_CARE_PROVIDER_SITE_OTHER): Payer: Medicare Other | Admitting: Internal Medicine

## 2017-10-28 VITALS — BP 150/80 | HR 68 | Temp 98.0°F | Ht 62.0 in | Wt 119.5 lb

## 2017-10-28 DIAGNOSIS — R1319 Other dysphagia: Secondary | ICD-10-CM

## 2017-10-28 DIAGNOSIS — R1310 Dysphagia, unspecified: Secondary | ICD-10-CM

## 2017-10-28 NOTE — Patient Instructions (Signed)
DG Esophagram.  

## 2017-10-28 NOTE — Progress Notes (Signed)
Subjective:    Patient ID: Jessica Escobar, female    DOB: Jan 25, 1951, 67 y.o.   MRN: [id number]  HPI Here today for f/u. Last seen in November of 2016.  Hx of chronic GERD and maintained on Omeprazole. Colonoscopy in 2017 (screening) was normal.  She tells me today she has been having spells of not being able to swallow. Occurs about once every other week. She says anything will hang. Her appetite is good. She has lost from 139 in 2016 to 119.5.  No abdominal pain. She usually has a BM every other day.  GERD controlled for the most part with Omeprazole.       10/20/2014 EGD(ED not performed)  Indications: Patient is 67 year old Caucasian female with chronic GERD who also has rheumatoid arthritis and lupus who presents with three-month history of dysphagia and odynophagia. She states swallowing difficulty started after she was switched from PPI to Zantac because of potential side effects.  Impression: Extensive ulceration involving distal 8 cm of esophageal mucosa. 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 Small sliding hiatal hernia. No evidence of peptic ulcer disease or gastritis. She has acid reflux which is controlled with Zantac.    Review of Systems Past Medical History:  Diagnosis Date  . Connective tissue disease (HCC)   . Lupus (HCC)   . Pulmonary hypertension (HCC)   . RA (rheumatoid arthritis) (HCC)   . Raynaud disease     Past Surgical History:  Procedure Laterality Date  . BILATERAL CARPAL TUNNEL RELEASE    . Broken femur rod    . CATARACT EXTRACTION     bilateral  . COLONOSCOPY N/A 08/29/2015   Procedure: COLONOSCOPY;  Surgeon: [name], MD;  Location: [facility];  Service: Endoscopy;  Laterality: N/A;  1030  . ESOPHAGOGASTRODUODENOSCOPY N/A 10/20/2014   Procedure:  ESOPHAGOGASTRODUODENOSCOPY (EGD);  Surgeon: [name], MD;  Location: [facility];  Service: Endoscopy;  Laterality: N/A;  210    Allergies  Allergen Reactions  . Daypro [Oxaprozin]     Upset stomach  . Penicillins     Rash,hives    Current Outpatient Medications on File Prior to Visit  Medication Sig Dispense Refill  . amLODipine (NORVASC) 2.5 MG tablet Take 2.5 mg by mouth daily.    . Ascorbic Acid (VITAMIN C) 100 MG tablet Take 100 mg by mouth daily.    . calcium carbonate (OS-CAL) 600 MG TABS tablet Take 600 mg by mouth 3 (three) times daily with meals. With D    . conjugated estrogens (PREMARIN) vaginal cream Place 1 Applicatorful vaginally daily.    Marland Kitchen Fish Oil-Cholecalciferol (FISH OIL + D3 PO) Take 1,200 mg by mouth 3 (three) times daily.     . hydroxychloroquine (PLAQUENIL) 200 MG tablet Take by mouth daily. And one every other night    . lactobacillus acidophilus (BACID) TABS tablet Take 1 tablet by mouth every evening.     . loratadine (CLARITIN) 10 MG tablet Take 10 mg by mouth daily.    . naproxen sodium (ANAPROX) 220 MG tablet Take 220-440 mg by mouth See admin instructions. Two in am and one at night    . omeprazole (PRILOSEC) 20 MG capsule TAKE ONE CAPSULE BY MOUTH 30 MINUTES BEFORE BREAKFAST AND EVENING MEAL 60 capsule 2  . pravastatin (PRAVACHOL) 80 MG tablet Take 80 mg by mouth daily.    . Tadalafil, PAH, (ADCIRCA) 20 MG TABS Take 40 mg by  mouth 2 (two) times daily before a meal.     . Vitamin D, Ergocalciferol, (DRISDOL) 50000 UNITS CAPS capsule Take 50,000 Units by mouth. Every other week.     No current facility-administered medications on file prior to visit.         Objective:   Physical Exam Blood pressure (!) 150/80, pulse 68, temperature 98 F (36.7 C), height 5\' 2"  (1.575 m), weight 119 lb 8 oz (54.2 kg).  Alert and oriented. Skin warm and dry. Oral mucosa is moist.   . Sclera anicteric, conjunctivae is pink. Thyroid not enlarged. No cervical  lymphadenopathy. Lungs clear. Heart regular rate and rhythm.  Abdomen is soft. Bowel sounds are positive. No hepatomegaly. No abdominal masses felt. No tenderness.  No edema to lower extremities.           Assessment & Plan:  GERD. Continue the Omeprazole. Hx of esophageal stricture. Dysphagia: DG esophagram.

## 2017-11-02 ENCOUNTER — Other Ambulatory Visit (INDEPENDENT_AMBULATORY_CARE_PROVIDER_SITE_OTHER): Payer: Self-pay | Admitting: Internal Medicine

## 2017-11-04 ENCOUNTER — Ambulatory Visit (HOSPITAL_COMMUNITY): Payer: Medicare Other

## 2017-11-05 ENCOUNTER — Ambulatory Visit (HOSPITAL_COMMUNITY): Payer: Medicare Other

## 2017-11-10 ENCOUNTER — Ambulatory Visit (HOSPITAL_COMMUNITY)
Admission: RE | Admit: 2017-11-10 | Discharge: 2017-11-10 | Disposition: A | Discharge status: Home / Self Care | Payer: Medicare Other | Source: Ambulatory Visit | Attending: Internal Medicine | Admitting: Internal Medicine

## 2017-11-10 DIAGNOSIS — K219 Gastro-esophageal reflux disease without esophagitis: Secondary | ICD-10-CM | POA: Insufficient documentation

## 2017-11-10 DIAGNOSIS — R1310 Dysphagia, unspecified: Secondary | ICD-10-CM | POA: Insufficient documentation

## 2017-11-10 DIAGNOSIS — R1319 Other dysphagia: Secondary | ICD-10-CM

## 2017-11-12 ENCOUNTER — Other Ambulatory Visit (INDEPENDENT_AMBULATORY_CARE_PROVIDER_SITE_OTHER): Payer: Self-pay | Admitting: Internal Medicine

## 2017-11-12 ENCOUNTER — Telehealth (INDEPENDENT_AMBULATORY_CARE_PROVIDER_SITE_OTHER): Payer: Self-pay | Admitting: Internal Medicine

## 2017-11-12 DIAGNOSIS — R1310 Dysphagia, unspecified: Secondary | ICD-10-CM

## 2017-11-12 DIAGNOSIS — R1319 Other dysphagia: Secondary | ICD-10-CM

## 2017-11-12 DIAGNOSIS — K219 Gastro-esophageal reflux disease without esophagitis: Secondary | ICD-10-CM

## 2017-11-12 NOTE — Telephone Encounter (Signed)
Jessica Escobar, EGD/possible ED. Reflux, dyshagia.

## 2017-11-16 ENCOUNTER — Encounter (INDEPENDENT_AMBULATORY_CARE_PROVIDER_SITE_OTHER): Payer: Self-pay | Admitting: *Deleted

## 2017-11-16 DIAGNOSIS — K219 Gastro-esophageal reflux disease without esophagitis: Secondary | ICD-10-CM | POA: Insufficient documentation

## 2017-11-16 DIAGNOSIS — R1310 Dysphagia, unspecified: Secondary | ICD-10-CM | POA: Insufficient documentation

## 2017-11-16 DIAGNOSIS — R1319 Other dysphagia: Secondary | ICD-10-CM | POA: Insufficient documentation

## 2017-11-16 NOTE — Telephone Encounter (Signed)
EGD/ED sch'd 02/11/18 at 1030 (930), patient aware, instructions mailed

## 2018-02-11 ENCOUNTER — Encounter (HOSPITAL_COMMUNITY)
Admission: RE | Disposition: A | Discharge status: Home / Self Care | Payer: Self-pay | Source: Home / Self Care | Attending: Internal Medicine

## 2018-02-11 ENCOUNTER — Ambulatory Visit (HOSPITAL_COMMUNITY)
Admission: RE | Admit: 2018-02-11 | Discharge: 2018-02-11 | Disposition: A | Discharge status: Home / Self Care | Payer: Medicare Other | Attending: Internal Medicine | Admitting: Internal Medicine

## 2018-02-11 ENCOUNTER — Encounter (HOSPITAL_COMMUNITY): Payer: Self-pay | Admitting: *Deleted

## 2018-02-11 ENCOUNTER — Other Ambulatory Visit: Payer: Self-pay

## 2018-02-11 DIAGNOSIS — Z791 Long term (current) use of non-steroidal anti-inflammatories (NSAID): Secondary | ICD-10-CM | POA: Insufficient documentation

## 2018-02-11 DIAGNOSIS — K219 Gastro-esophageal reflux disease without esophagitis: Secondary | ICD-10-CM | POA: Diagnosis not present

## 2018-02-11 DIAGNOSIS — Z79899 Other long term (current) drug therapy: Secondary | ICD-10-CM | POA: Insufficient documentation

## 2018-02-11 DIAGNOSIS — R1319 Other dysphagia: Secondary | ICD-10-CM | POA: Insufficient documentation

## 2018-02-11 DIAGNOSIS — Z87891 Personal history of nicotine dependence: Secondary | ICD-10-CM | POA: Diagnosis not present

## 2018-02-11 DIAGNOSIS — Z79890 Hormone replacement therapy (postmenopausal): Secondary | ICD-10-CM | POA: Insufficient documentation

## 2018-02-11 DIAGNOSIS — M069 Rheumatoid arthritis, unspecified: Secondary | ICD-10-CM | POA: Diagnosis not present

## 2018-02-11 DIAGNOSIS — I272 Pulmonary hypertension, unspecified: Secondary | ICD-10-CM | POA: Insufficient documentation

## 2018-02-11 DIAGNOSIS — K228 Other specified diseases of esophagus: Secondary | ICD-10-CM

## 2018-02-11 DIAGNOSIS — I7300 Raynaud's syndrome without gangrene: Secondary | ICD-10-CM | POA: Diagnosis not present

## 2018-02-11 DIAGNOSIS — K317 Polyp of stomach and duodenum: Secondary | ICD-10-CM | POA: Diagnosis not present

## 2018-02-11 DIAGNOSIS — M329 Systemic lupus erythematosus, unspecified: Secondary | ICD-10-CM | POA: Insufficient documentation

## 2018-02-11 DIAGNOSIS — R1314 Dysphagia, pharyngoesophageal phase: Secondary | ICD-10-CM | POA: Insufficient documentation

## 2018-02-11 DIAGNOSIS — K210 Gastro-esophageal reflux disease with esophagitis: Secondary | ICD-10-CM | POA: Diagnosis not present

## 2018-02-11 DIAGNOSIS — R1310 Dysphagia, unspecified: Secondary | ICD-10-CM

## 2018-02-11 HISTORY — PX: POLYPECTOMY: SHX5525

## 2018-02-11 HISTORY — DX: Gastro-esophageal reflux disease without esophagitis: K21.9

## 2018-02-11 HISTORY — PX: ESOPHAGEAL DILATION: SHX303

## 2018-02-11 HISTORY — PX: ESOPHAGOGASTRODUODENOSCOPY: SHX5428

## 2018-02-11 SURGERY — EGD (ESOPHAGOGASTRODUODENOSCOPY)
Anesthesia: Moderate Sedation

## 2018-02-11 MED ORDER — TRAMADOL HCL 50 MG PO TABS: 50.0000 mg | ORAL_TABLET | Freq: Three times a day (TID) | ORAL | 0 refills | Status: DC | PRN

## 2018-02-11 MED ORDER — MIDAZOLAM HCL 5 MG/5ML IJ SOLN
INTRAMUSCULAR | Status: AC
  Filled 2018-02-11: qty 10

## 2018-02-11 MED ORDER — LIDOCAINE VISCOUS HCL 2 % MT SOLN
OROMUCOSAL | Status: DC | PRN
  Administered 2018-02-11: 1 via OROMUCOSAL

## 2018-02-11 MED ORDER — MEPERIDINE HCL 50 MG/ML IJ SOLN
INTRAMUSCULAR | Status: DC | PRN
  Administered 2018-02-11 (×2): 25 mg via INTRAVENOUS

## 2018-02-11 MED ORDER — LIDOCAINE VISCOUS HCL 2 % MT SOLN
OROMUCOSAL | Status: AC
  Filled 2018-02-11: qty 15

## 2018-02-11 MED ORDER — STERILE WATER FOR IRRIGATION IR SOLN
Status: DC | PRN
  Administered 2018-02-11: 1.5 mL

## 2018-02-11 MED ORDER — MEPERIDINE HCL 50 MG/ML IJ SOLN
INTRAMUSCULAR | Status: AC
  Filled 2018-02-11: qty 1

## 2018-02-11 MED ORDER — MIDAZOLAM HCL 5 MG/5ML IJ SOLN
INTRAMUSCULAR | Status: DC | PRN
  Administered 2018-02-11: 1 mg via INTRAVENOUS
  Administered 2018-02-11 (×2): 2 mg via INTRAVENOUS

## 2018-02-11 MED ORDER — SODIUM CHLORIDE 0.9 % IV SOLN
INTRAVENOUS | Status: DC
  Administered 2018-02-11: 10:00:00 via INTRAVENOUS

## 2018-02-11 NOTE — Op Note (Signed)
[facility] Patient Name: Jessica Escobar Procedure Date: 02/11/2018 9:53 AM MRN: [id number] Date of Birth: September 13, 1950 Attending MD: Lionel DecemberNajeeb Corinn Stoltzfus , MD CSN: [id number] Age: [age] Admit Type: Outpatient Procedure:                Upper GI endoscopy Indications:              Esophageal dysphagia Providers:                Lionel DecemberNajeeb Mende Biswell, MD, Buel ReamAngela A. Thomasena Edisollins RN, RN, Dyann Ruddleonya                            Wilson Referring MD:             Renaldo Harrisonaniel Addis DO, DO Medicines:                Lidocaine spray, Meperidine 50 mg IV, Midazolam 5                            mg IV Complications:            No immediate complications. Estimated Blood Loss:     Estimated blood loss: none. Procedure:                Pre-Anesthesia Assessment:                           - Prior to the procedure, a History and Physical                            was performed, and patient medications and                            allergies were reviewed. The patient's tolerance of                            previous anesthesia was also reviewed. The risks                            and benefits of the procedure and the sedation                            options and risks were discussed with the patient.                            All questions were answered, and informed consent                            was obtained. Prior Anticoagulants: The patient                            last took naproxen 1 day prior to the procedure.                            ASA Grade Assessment: III - A patient with severe  systemic disease. After reviewing the risks and                            benefits, the patient was deemed in satisfactory                            condition to undergo the procedure.                           After obtaining informed consent, the endoscope was                            passed under direct vision. Throughout the                            procedure, the patient's blood pressure, pulse,  and                            oxygen saturations were monitored continuously. The                            [id number]-[id number] ([id number]) scope was introduced through the                            mouth, and advanced to the second part of duodenum.                            The upper GI endoscopy was accomplished without                            difficulty. The patient tolerated the procedure                            well. Scope In: 10:22:25 AM Scope Out: 10:37:34 AM Total Procedure Duration: 0 hours 15 minutes 9 seconds  Findings:      The proximal esophagus and mid esophagus were normal.      A healed ulcer was found in the distal esophagus.      The Z-line was irregular and was found 35 cm from the incisors.      No endoscopic abnormality was evident in the esophagus to explain the       patient's complaint of dysphagia. It was decided, however, to proceed       with dilation of the entire esophagus. Dilation performed by passing 54       Fr dilator. The dilation site was examined following endoscope       reinsertion and showed no change and no bleeding, mucosal tear or       perforation.      A single 10 mm semi-sessile polyp with no bleeding and no stigmata of       recent bleeding was found at the pylorus. The polyp was removed with a       hot snare. Resection and retrieval were complete.      The exam of the stomach was otherwise normal.      The duodenal bulb and second portion of the duodenum  were normal. Impression:               - Normal proximal esophagus and mid esophagus.                           - Scar in the distal esophagus secondary to healed                            esophagitis.                           - Z-line irregular, 35 cm from the incisors.                           - No endoscopic esophageal abnormality to explain                            patient's dysphagia. Esophagus dilated.                           - A single ulcerated gastric polyp. Resected and                             retrieved.                           - Normal duodenal bulb and second portion of the                            duodenum. Moderate Sedation:      Moderate (conscious) sedation was administered by the endoscopy nurse       and supervised by the endoscopist. The following parameters were       monitored: oxygen saturation, heart rate, blood pressure, CO2       capnography and response to care. Total physician intraservice time was       22 minutes. Recommendation:           - Patient has a contact number available for                            emergencies. The signs and symptoms of potential                            delayed complications were discussed with the                            patient. Return to normal activities tomorrow.                            Written discharge instructions were provided to the                            patient.                           - Resume previous diet today.                           -  Continue present medications.                           - No aspirin, ibuprofen, naproxen, or other                            non-steroidal anti-inflammatory drugs for 3 days.                           - Tramadol 50 mg po tid prn while Naprosyn on hold.                           - Await pathology results.                           - Repeat upper endoscopy PRN. Procedure Code(s):        --- Professional ---                           [phone], Esophagogastroduodenoscopy, flexible,                            transoral; with removal of tumor(s), polyp(s), or                            other lesion(s) by snare technique                           G0500, Moderate sedation services provided by the                            same physician or other qualified health care                            professional performing a gastrointestinal                            endoscopic service that sedation supports,                            requiring  the presence of an independent trained                            observer to assist in the monitoring of the                            patient's level of consciousness and physiological                            status; initial 15 minutes of intra-service time;                            patient age 30 years or older (additional time 59may  be reported with 09407, as appropriate) Diagnosis Code(s):        --- Professional ---                           K22.8, Other specified diseases of esophagus                           K31.7, Polyp of stomach and duodenum                           R13.14, Dysphagia, pharyngoesophageal phase CPT copyright 2018 American Medical Association. All rights reserved. The codes documented in this report are preliminary and upon coder review may  be revised to meet current compliance requirements. Lionel December, MD Lionel December, MD 02/11/2018 10:51:36 AM This report has been signed electronically. Number of Addenda: 0

## 2018-02-11 NOTE — Discharge Instructions (Signed)
Upper Endoscopy, Adult, Care After This sheet gives you information about how to care for yourself after your procedure. Your health care provider may also give you more specific instructions. If you have problems or questions, contact your health care provider. What can I expect after the procedure? After the procedure, it is common to have:  A sore throat.  Mild stomach pain or discomfort.  Bloating.  Nausea. Follow these instructions at home:   Follow instructions from your health care provider about what to eat or drink after your procedure.  Return to your normal activities as told by your health care provider. Ask your health care provider what activities are safe for you.  Take over-the-counter and prescription medicines only as told by your health care provider.  Do not drive for 24 hours if you were given a sedative during your procedure.  Keep all follow-up visits as told by your health care provider. This is important. Contact a health care provider if you have:  A sore throat that lasts longer than one day.  Trouble swallowing. Get help right away if:  You vomit blood or your vomit looks like coffee grounds.  You have: ? A fever. ? Bloody, black, or tarry stools. ? A severe sore throat or you cannot swallow. ? Difficulty breathing. ? Severe pain in your chest or abdomen. Summary  After the procedure, it is common to have a sore throat, mild stomach discomfort, bloating, and nausea.  Do not drive for 24 hours if you were given a sedative during the procedure.  Follow instructions from your health care provider about what to eat or drink after your procedure.  Return to your normal activities as told by your health care provider. This information is not intended to replace advice given to you by your health care provider. Make sure you discuss any questions you have with your health care provider. Document Released: 07/15/2011 Document Revised: 06/15/2017  Document Reviewed: 06/15/2017 [employer] Interactive Patient Education  2019 [employer] No aspirin or NSAIDs for 3 days. Can take tramadol 50 mg up to 3 times a day while Aleve on hold. Resume other medications as before. No resume usual diet. Driving for 24 hours. Physician will call with biopsy results.

## 2018-02-11 NOTE — H&P (Signed)
Jessica Escobar is an 68 y.o. female.   Chief Complaint: Patient is here for EGD and EGD. HPI: Patient is 68 year old Caucasian female with history of grade D reflux esophagitis who presents with intermittent solid food dysphagia.  She says she was also having nocturnal burping and heartburn.  Since she has been using antacid at night she is not having this problem.  She has good appetite.  She states she is still losing weight even though she eats plenty.  She denies nausea vomiting epigastric pain or melena. She had barium swallow in October 2019 which suggested impaired esophageal motility and reflux but no stricture noted. Patient takes 3 tablets of Aleve per day.  She takes 1 in the morning and 3 in the evening.  She does not take aspirin or anticoagulants. She does not smoke cigarettes or drink alcohol.  Past Medical History:  Diagnosis Date  . Connective tissue disease (HCC)   . GERD (gastroesophageal reflux disease)   . Lupus (HCC)   . Pulmonary hypertension (HCC)   . RA (rheumatoid arthritis) (HCC)   . Raynaud disease     Past Surgical History:  Procedure Laterality Date  . BILATERAL CARPAL TUNNEL RELEASE    . Broken femur rod    . CATARACT EXTRACTION     bilateral  . COLONOSCOPY N/A 08/29/2015   Procedure: COLONOSCOPY;  Surgeon: [name], MD;  [facility];  Service: Endoscopy;  Laterality: N/A;  1030  . ESOPHAGOGASTRODUODENOSCOPY N/A 10/20/2014   Procedure: ESOPHAGOGASTRODUODENOSCOPY (EGD);  Surgeon: [name], MD;  [facility];  Service: Endoscopy;  Laterality: N/A;  210    History reviewed. No pertinent family history. Social History:  reports that she has quit smoking. She has quit using smokeless tobacco. She reports that she does not drink alcohol or use drugs.  Allergies:  Allergies  Allergen Reactions  . Daypro [Oxaprozin] Other (See Comments)    Upset stomach  . Penicillins Hives, Rash and Other (See Comments)    DID THE  REACTION INVOLVE: Swelling of the face/tongue/throat, SOB, or low BP? No Sudden or severe rash/hives, skin peeling, or the inside of the mouth or nose? No Did it require medical treatment? No When did it last happen? Long time ago, early 1980s If all above answers are "NO", may proceed with cephalosporin use     Medications Prior to Admission  Medication Sig Dispense Refill  . amLODipine (NORVASC) 2.5 MG tablet Take 2.5 mg by mouth daily.    . Calcium Carb-Cholecalciferol (CALCIUM 600+D3 PO) Take 1 tablet by mouth 3 (three) times daily.    Marland Kitchen conjugated estrogens (PREMARIN) vaginal cream Place 1 Applicatorful vaginally 2 (two) times a week.     . cycloSPORINE, PF, 0.09 % SOLN Place 1 drop into both eyes 2 (two) times daily.    Marland Kitchen docusate sodium (COLACE) 100 MG capsule Take 100 mg by mouth every other day.    . fexofenadine (ALLEGRA) 180 MG tablet Take 180 mg by mouth at bedtime.    . hydroxychloroquine (PLAQUENIL) 200 MG tablet Take 200 mg by mouth daily.     . Lactobacillus (PROBIOTIC ACIDOPHILUS PO) Take 1 capsule by mouth daily with lunch.    . Multiple Vitamin (MULTIVITAMIN WITH MINERALS) TABS tablet Take 1 tablet by mouth daily with lunch.    . naproxen sodium (ANAPROX) 220 MG tablet Take 220-440 mg by mouth See admin instructions. Take 1 tablet (220 mg) by mouth with lunch & take 2 tablets (  440 mg) at night.    . Omega-3 Fatty Acids (FISH OIL PO) Take 1 capsule by mouth 2 (two) times daily. Lunch & bedtime    . omeprazole (PRILOSEC) 20 MG capsule  TAKE 1 CAPSULE BY MOUTH 30 MINUTES BEFORE BREAKFAST AND EVENING MEAL (Patient taking differently: Take 20 mg by mouth 2 (two) times daily before a meal. ) 60 capsule 5  . oxybutynin (DITROPAN) 5 MG tablet Take 5 mg by mouth 2 (two) times daily.    . Polyvinyl Alcohol-Povidone (REFRESH OP) Place 1 drop into both eyes every 2 (two) hours.    . pravastatin (PRAVACHOL) 80 MG tablet Take 80 mg by mouth daily with supper.     . sodium chloride (MURO  128) 2 % ophthalmic solution Place 1 drop into both eyes daily.    . sodium chloride (MURO 128) 5 % ophthalmic ointment Place 1 application into both eyes at bedtime.    . Tadalafil, PAH, (ADCIRCA) 20 MG TABS Take 40 mg by mouth daily with lunch.     . vitamin C (ASCORBIC ACID) 500 MG tablet Take 500 mg by mouth daily with lunch.    . Vitamin D, Ergocalciferol, (DRISDOL) 50000 UNITS CAPS capsule Take 50,000 Units by mouth every 14 (fourteen) days. Every other week.    . zoledronic acid (RECLAST) 5 MG/100ML SOLN injection Inject 5 mg into the vein once. Once a year      No results found for this or any previous visit (from the past 48 hour(s)). No results found.  ROS  Blood pressure (!) 147/75, pulse 73, temperature 97.8 F (36.6 C), temperature source Oral, resp. rate 13, height 5\' 2"  (1.575 m), weight 51.7 kg, SpO2 100 %. Physical Exam  Constitutional:  Well-developed thin Caucasian female in NAD.  HENT:  Mouth/Throat: Oropharynx is clear and moist.  She has upper and lower dentures.  Eyes: Conjunctivae are normal. No scleral icterus.  Neck: No thyromegaly present.  Cardiovascular: Normal rate, regular rhythm and normal heart sounds.  No murmur heard. Respiratory: Effort normal and breath sounds normal.  GI: Soft. She exhibits no distension and no mass. There is no abdominal tenderness.  Musculoskeletal:        General: No edema.  Lymphadenopathy:    She has no cervical adenopathy.  Neurological: She is alert.  Skin: Skin is warm and dry.     Assessment/Plan Esophageal dysphagia and patient with history of severe reflux esophagitis. EGD and possible ED.  Lionel DecemberNajeeb , MD 02/11/2018, 10:13 AM

## 2018-02-17 ENCOUNTER — Encounter (HOSPITAL_COMMUNITY): Payer: Self-pay | Admitting: Internal Medicine

## 2018-04-20 ENCOUNTER — Ambulatory Visit (INDEPENDENT_AMBULATORY_CARE_PROVIDER_SITE_OTHER): Payer: Medicare Other | Admitting: Internal Medicine

## 2018-05-01 ENCOUNTER — Other Ambulatory Visit (INDEPENDENT_AMBULATORY_CARE_PROVIDER_SITE_OTHER): Payer: Self-pay | Admitting: Internal Medicine

## 2018-05-13 ENCOUNTER — Ambulatory Visit (INDEPENDENT_AMBULATORY_CARE_PROVIDER_SITE_OTHER): Payer: Medicare Other | Admitting: Internal Medicine

## 2018-05-13 ENCOUNTER — Other Ambulatory Visit: Payer: Self-pay

## 2018-05-13 ENCOUNTER — Encounter (INDEPENDENT_AMBULATORY_CARE_PROVIDER_SITE_OTHER): Payer: Self-pay | Admitting: Internal Medicine

## 2018-05-13 DIAGNOSIS — R634 Abnormal weight loss: Secondary | ICD-10-CM

## 2018-05-13 NOTE — Addendum Note (Signed)
Addended by: Len Blalock on: 05/13/2018 11:34 AM   Modules accepted: Orders

## 2018-05-13 NOTE — Progress Notes (Addendum)
Subjective:    Patient ID: Jessica Escobar, female    DOB: 09/14/1950, 68 y.o.   MRN: [id number] Start 10:15am. Ended 1033. Total time 17 minutes. Patient is agreeable to talk with me. She is at home. I am in the office. This is a telephone OV due to risk of COVID-19. Marland Kitchen. Unable to do video OV. Reason for visit: weight loss.   PCP Dr.  Renaldo Harrisonaniel Addis.  Her weight today 107.  For the past 3 yrs she has been losing weight. Has lost about 30 pounds over the past 3 years.  Her weight October  2019 was 119.  She has not been trying to lose weight. She says her stomach hurts all the time. Her appetite is good. She is eating 2 meals a day. Eats at lunch and at supper. She has a BM daily and may skip a day or 2. Stools are soft and not formed. She is using Miralax.  No melena or BRRB.   Her last EGD was in 02/11/2018 for dysphagia. (Dr. Karilyn Cotaehman) Impression:               - Normal proximal esophagus and mid esophagus.                           - Scar in the distal esophagus secondary to healed                            esophagitis.                           - Z-line irregular, 35 cm from the incisors.                           - No endoscopic esophageal abnormality to explain                            patient's dysphagia. Esophagus dilated.                           - A single ulcerated gastric polyp. Resected and                            retrieved.                           - Normal duodenal bulb and second portion of the                            duodenum.   08/29/2015 Colonoscopy Dr. Karilyn Cotaehman: (screening).  Impression:               - The entire examined colon is normal.                           - extrinsic from nodular area anteriorly felt to be                            cervix. found on digital rectal exam.  08/30/2014 127 12/19/2014 139 10/28/2017 119 05/12/2018 107    Hx of  Lupus, RA, pulmonary hypertension, Raynaud disease.   Review of Systems Past Medical History:  Diagnosis  Date  . Connective tissue disease (HCC)   . GERD (gastroesophageal reflux disease)   . Lupus (HCC)   . Pulmonary hypertension (HCC)   . RA (rheumatoid arthritis) (HCC)   . Raynaud disease     Past Surgical History:  Procedure Laterality Date  . BILATERAL CARPAL TUNNEL RELEASE    . Broken femur rod    . CATARACT EXTRACTION     bilateral  . COLONOSCOPY N/A 08/29/2015   Procedure: COLONOSCOPY;  Surgeon: [name], MD;  [facility];  Service: Endoscopy;  Laterality: N/A;  1030  . ESOPHAGEAL DILATION N/A 02/11/2018   Procedure: ESOPHAGEAL DILATION;  Surgeon: [name], MD;  [facility];  Service: Endoscopy;  Laterality: N/A;  . ESOPHAGOGASTRODUODENOSCOPY N/A 10/20/2014   Procedure: ESOPHAGOGASTRODUODENOSCOPY (EGD);  Surgeon: [name], MD;  [facility];  Service: Endoscopy;  Laterality: N/A;  210  . ESOPHAGOGASTRODUODENOSCOPY N/A 02/11/2018   Procedure: ESOPHAGOGASTRODUODENOSCOPY (EGD);  Surgeon: [name], MD;  [facility];  Service: Endoscopy;  Laterality: N/A;  10:30  . POLYPECTOMY  02/11/2018   Procedure: POLYPECTOMY;  Surgeon: [name], MD;  [facility];  Service: Endoscopy;;  prepyloric(HSx1)    Allergies  Allergen Reactions  . Daypro [Oxaprozin] Other (See Comments)    Upset stomach  . Penicillins Hives, Rash and Other (See Comments)    DID THE REACTION INVOLVE: Swelling of the face/tongue/throat, SOB, or low BP? No Sudden or severe rash/hives, skin peeling, or the inside of the mouth or nose? No Did it require medical treatment? No When did it last happen? Long time ago, early 1980s If all above answers are "NO", may proceed with cephalosporin use     Current Outpatient Medications on File Prior to Visit  Medication Sig Dispense Refill  . amLODipine (NORVASC) 2.5 MG tablet Take 5 mg by mouth daily.     Marland Kitchen conjugated estrogens (PREMARIN) vaginal cream Place 1 Applicatorful vaginally 2  (two) times a week.     . cycloSPORINE, PF, 0.09 % SOLN Place 1 drop into both eyes 2 (two) times daily.    . hydroxychloroquine (PLAQUENIL) 200 MG tablet Take 200 mg by mouth daily.     . Lactobacillus (PROBIOTIC ACIDOPHILUS PO) Take 1 capsule by mouth daily with lunch.    . methotrexate (RHEUMATREX) 2.5 MG tablet Take 2.5 mg by mouth once a week. Caution:Chemotherapy. Protect from light. Takes 6 tabs every Monday    . Multiple Vitamin (MULTIVITAMIN WITH MINERALS) TABS tablet Take 1 tablet by mouth daily with lunch.    . naproxen sodium (ALEVE) 220 MG tablet Take 1-2 tablets (220-440 mg total) by mouth See admin instructions. Take 1 tablet (220 mg) by mouth with lunch & take 2 tablets (440 mg) at night.    . Omega-3 Fatty Acids (FISH OIL PO) Take 1 capsule by mouth 2 (two) times daily. Lunch & bedtime    . omeprazole (PRILOSEC) 20 MG capsule TAKE 1 CAPSULE BY MOUTH 30 MINUTES BEFORE BREAKFAST AND EVENING MEAL 60 capsule 4  . oxybutynin (DITROPAN) 5 MG tablet Take 5 mg by mouth 2 (two) times daily.    . polyethylene glycol (MIRALAX / GLYCOLAX) 17 g packet Take 17 g by mouth as needed.    . Polyvinyl Alcohol-Povidone (REFRESH OP) Place 1 drop into both eyes every 2 (two) hours.    Marland Kitchen  pravastatin (PRAVACHOL) 80 MG tablet Take 80 mg by mouth daily with supper.     . sodium chloride (MURO 128) 2 % ophthalmic solution Place 1 drop into both eyes daily.    . sodium chloride (MURO 128) 5 % ophthalmic ointment Place 1 application into both eyes at bedtime.    . Tadalafil, PAH, (ADCIRCA) 20 MG TABS Take 40 mg by mouth daily with lunch.     . traMADol (ULTRAM) 50 MG tablet Take 1 tablet (50 mg total) by mouth 3 (three) times daily as needed. 15 tablet 0  . vitamin C (ASCORBIC ACID) 500 MG tablet Take 500 mg by mouth daily with lunch.    . zoledronic acid (RECLAST) 5 MG/100ML SOLN injection Inject 5 mg into the vein once. Once a year     No current facility-administered medications on file prior to visit.          Objective:   Physical Exam  Deferred.       Assessment & Plan:  Weight loss. CT abdomen/pelvis with CM.  CBC, CMET, Cortisol level,TSH. (She will have labs drawn Monday) GERD Continue the Omeprazole.

## 2018-05-19 ENCOUNTER — Other Ambulatory Visit (INDEPENDENT_AMBULATORY_CARE_PROVIDER_SITE_OTHER): Payer: Self-pay | Admitting: *Deleted

## 2018-05-19 DIAGNOSIS — R1319 Other dysphagia: Secondary | ICD-10-CM

## 2018-05-19 DIAGNOSIS — R1310 Dysphagia, unspecified: Secondary | ICD-10-CM

## 2018-05-19 LAB — COMPREHENSIVE METABOLIC PANEL
AG Ratio: 1.5 (calc) (ref 1.0–2.5)
ALT: 19 U/L (ref 6–29)
AST: 27 U/L (ref 10–35)
Albumin: 4.4 g/dL (ref 3.6–5.1)
Alkaline phosphatase (APISO): 56 U/L (ref 37–153)
BUN: 18 mg/dL (ref 7–25)
CO2: 29 mmol/L (ref 20–32)
Calcium: 9.4 mg/dL (ref 8.6–10.4)
Chloride: 101 mmol/L (ref 98–110)
Creat: 0.71 mg/dL (ref 0.50–0.99)
Globulin: 3 g/dL (calc) (ref 1.9–3.7)
Glucose, Bld: 80 mg/dL (ref 65–99)
Potassium: 3.9 mmol/L (ref 3.5–5.3)
Sodium: 139 mmol/L (ref 135–146)
Total Bilirubin: 0.5 mg/dL (ref 0.2–1.2)
Total Protein: 7.4 g/dL (ref 6.1–8.1)

## 2018-05-19 LAB — CBC WITH DIFFERENTIAL/PLATELET
Absolute Monocytes: 442 cells/uL (ref 200–950)
Basophils Absolute: 22 cells/uL (ref 0–200)
Basophils Relative: 0.4 %
Eosinophils Absolute: 112 cells/uL (ref 15–500)
Eosinophils Relative: 2 %
HCT: 38.3 % (ref 35.0–45.0)
Hemoglobin: 12.9 g/dL (ref 11.7–15.5)
Lymphs Abs: 655 cells/uL — ABNORMAL LOW (ref 850–3900)
MCH: 32.8 pg (ref 27.0–33.0)
MCHC: 33.7 g/dL (ref 32.0–36.0)
MCV: 97.5 fL (ref 80.0–100.0)
MPV: 9.9 fL (ref 7.5–12.5)
Monocytes Relative: 7.9 %
Neutro Abs: 4368 cells/uL (ref 1500–7800)
Neutrophils Relative %: 78 %
Platelets: 166 10*3/uL (ref 140–400)
RBC: 3.93 10*6/uL (ref 3.80–5.10)
RDW: 14.3 % (ref 11.0–15.0)
Total Lymphocyte: 11.7 %
WBC: 5.6 10*3/uL (ref 3.8–10.8)

## 2018-05-19 LAB — CORTISOL: Cortisol, Plasma: 15.8 ug/dL

## 2018-05-19 LAB — TSH: TSH: 2.07 mIU/L (ref 0.40–4.50)

## 2018-05-19 MED ORDER — OMEPRAZOLE 20 MG PO CPDR: 20.0000 mg | DELAYED_RELEASE_CAPSULE | Freq: Two times a day (BID) | ORAL | 3 refills | Status: DC

## 2018-05-21 ENCOUNTER — Ambulatory Visit (HOSPITAL_COMMUNITY)
Admission: RE | Admit: 2018-05-21 | Discharge: 2018-05-21 | Disposition: A | Discharge status: Home / Self Care | Payer: Medicare Other | Source: Ambulatory Visit | Attending: Internal Medicine | Admitting: Internal Medicine

## 2018-05-21 ENCOUNTER — Other Ambulatory Visit: Payer: Self-pay

## 2018-05-21 DIAGNOSIS — R634 Abnormal weight loss: Secondary | ICD-10-CM | POA: Diagnosis not present

## 2018-05-21 MED ORDER — IOHEXOL 300 MG/ML  SOLN
100.0000 mL | Freq: Once | INTRAMUSCULAR | Status: AC | PRN
  Administered 2018-05-21: 100 mL via INTRAVENOUS

## 2018-05-24 ENCOUNTER — Telehealth (INDEPENDENT_AMBULATORY_CARE_PROVIDER_SITE_OTHER): Payer: Self-pay | Admitting: Internal Medicine

## 2018-05-24 DIAGNOSIS — R1011 Right upper quadrant pain: Secondary | ICD-10-CM

## 2018-05-24 NOTE — Telephone Encounter (Signed)
Ann, HIDA scan 

## 2018-05-25 ENCOUNTER — Encounter (INDEPENDENT_AMBULATORY_CARE_PROVIDER_SITE_OTHER): Payer: Self-pay | Admitting: *Deleted

## 2018-05-25 NOTE — Telephone Encounter (Signed)
HIDA scan shc'd 06/30/18 at 10 (945), npo after midnight, no pain medication, patient aware, instruction sheet mailed to patient

## 2018-06-09 ENCOUNTER — Ambulatory Visit (INDEPENDENT_AMBULATORY_CARE_PROVIDER_SITE_OTHER): Payer: Medicare Other | Admitting: Internal Medicine

## 2018-06-30 ENCOUNTER — Encounter (HOSPITAL_COMMUNITY): Payer: Self-pay

## 2018-06-30 ENCOUNTER — Other Ambulatory Visit: Payer: Self-pay

## 2018-06-30 ENCOUNTER — Encounter (HOSPITAL_COMMUNITY)
Admission: RE | Admit: 2018-06-30 | Discharge: 2018-06-30 | Disposition: A | Discharge status: Home / Self Care | Payer: Medicare Other | Source: Ambulatory Visit | Attending: Internal Medicine | Admitting: Internal Medicine

## 2018-06-30 DIAGNOSIS — R1011 Right upper quadrant pain: Secondary | ICD-10-CM | POA: Diagnosis present

## 2018-06-30 HISTORY — DX: Systemic involvement of connective tissue, unspecified: M35.9

## 2018-06-30 HISTORY — DX: Essential (primary) hypertension: I10

## 2018-06-30 MED ORDER — TECHNETIUM TC 99M MEBROFENIN IV KIT
5.0000 | PACK | Freq: Once | INTRAVENOUS | Status: AC | PRN
  Administered 2018-06-30: 4.9 via INTRAVENOUS

## 2018-07-01 ENCOUNTER — Telehealth (INDEPENDENT_AMBULATORY_CARE_PROVIDER_SITE_OTHER): Payer: Self-pay | Admitting: Internal Medicine

## 2018-07-01 DIAGNOSIS — K219 Gastro-esophageal reflux disease without esophagitis: Secondary | ICD-10-CM

## 2018-07-01 MED ORDER — OMEPRAZOLE 40 MG PO CPDR: 40.0000 mg | DELAYED_RELEASE_CAPSULE | Freq: Every day | ORAL | 0 refills | Status: DC

## 2018-07-01 NOTE — Telephone Encounter (Signed)
Rx for Omeprazole 40mg  for one month sent to her pharmacy

## 2018-09-01 ENCOUNTER — Telehealth (INDEPENDENT_AMBULATORY_CARE_PROVIDER_SITE_OTHER): Payer: Self-pay | Admitting: Nurse Practitioner

## 2018-09-01 ENCOUNTER — Other Ambulatory Visit (INDEPENDENT_AMBULATORY_CARE_PROVIDER_SITE_OTHER): Payer: Self-pay | Admitting: Nurse Practitioner

## 2018-09-01 MED ORDER — OMEPRAZOLE 40 MG PO CPDR: 40.0000 mg | DELAYED_RELEASE_CAPSULE | Freq: Every day | ORAL | 1 refills | Status: DC

## 2018-09-01 NOTE — Telephone Encounter (Signed)
I called patient, she requested refill of Prilosec 40mg  QD per [facility]. I advised office follow up in 3 months

## 2018-09-01 NOTE — Telephone Encounter (Signed)
Patient called stated she has been taking Prilosec 40 mg - seems to be working - she needs you to call her at ph# [phone]

## 2018-09-02 ENCOUNTER — Other Ambulatory Visit (INDEPENDENT_AMBULATORY_CARE_PROVIDER_SITE_OTHER): Payer: Self-pay | Admitting: Nurse Practitioner

## 2018-11-19 NOTE — Progress Notes (Signed)
Subjective:    Patient ID: Jessica Escobar, female    DOB: 11/05/1950, 68 y.o.   MRN: [id number]  HPI Jessica Escobar is a 68 year old female with a past medical history of hypertension, pulmonary hypertension, rheumatoid arthritis, Raynaud disease and GERD. She is no longer having RUQ pain since she started taking Omeprazole 40mg  once daily. She presents today for further evaluation regarding an abdominal hernia.  She noticed a lump in her right groin earlier in the spring, she also noticed a bulge to her right mid abdomen. She was seen by her PCP who assessed she most likely had an abdominal hernia.  She complains of having worse right mid to right lower abdominal pain when she is moving around a lot or if she is moving up in the bed.  Passing a bowel movement somewhat reduces this pain.  She is passing a soft bowel movement most days.  If she does not have a bowel movement for 2 days she takes MiraLAX.  No rectal bleeding or melena. She is eating 2 meals daily.  She reports her appetite is good.  She is drinking Ensure 1 can daily.  No fever, sweats or chills.  No further weight loss.  She reported weighing 119lbs in Oct 2019, 107lbs in April 2020 abd  today's weight is up to 110 pounds.  Abdominal/pelvic CT 05/21/2018 showed a probable splenic cyst and a left renal cyst.  An EGD 02/11/2018 showed a single ulcerated gastric hyperplastic polyp and healed esophagitis.  Her most recent colonoscopy was 09/16/2015 which showed a normal colon, see results below. Labs 05/17/2018: Hg 12.9. HCT 38.3.  HIDA Scan 06/30/2018: Gallbladder EF 91% Study within normal limits.  Abdominal/pelvic CT with contrast 05/21/2018: 1. 8 mm hypodense lesion in the superior aspect of the spleen too small to characterize likely reflecting a small cyst. 5 mm hypodensity in the spleen which fills in on delayed images likely reflecting a small hemangioma 2. No acute abdominal or pelvic pathology. 3. Left renal cyst. 4. Aortic  Atherosclerosis 5. Cardiomegaly  EGD 02/11/2018: - Normal proximal esophagus and mid esophagus. - Scar in the distal esophagus secondary to healed esophagitis. - Z-line irregular, 35 cm from the incisors. - No endoscopic esophageal abnormality to explain patient's dysphagia. Esophagus dilated. - A single ulcerated gastric hyperplastic polyp. - Normal duodenal bulb and second portion of the duodenum.  Colonoscopy 09/16/2015: - The entire examined colon is normal. - extrinsic from nodular area anteriorly felt to be cervix. found on digital rectal exam. - No specimens collected. - Prep was excellent. -10 year recall.   Past Medical History:  Diagnosis Date  . Collagen vascular disease ([place])   . Connective tissue disease ([place])   . GERD (gastroesophageal reflux disease)   . Hypertension   . Lupus ([place])   . Pulmonary hypertension ([place])   . RA (rheumatoid arthritis) ([place])   . Raynaud disease    Past Surgical History:  Procedure Laterality Date  . BILATERAL CARPAL TUNNEL RELEASE    . Broken femur rod    . CATARACT EXTRACTION     bilateral  . COLONOSCOPY N/A 08/29/2015   Procedure: COLONOSCOPY;  Surgeon: Rogene Houston, MD;  [facility];  Service: Endoscopy;  Laterality: N/A;  1030  . ESOPHAGEAL DILATION N/A 02/11/2018   Procedure: ESOPHAGEAL DILATION;  Surgeon: Rogene Houston, MD;  [facility];  Service: Endoscopy;  Laterality: N/A;  . ESOPHAGOGASTRODUODENOSCOPY N/A 10/20/2014   Procedure: ESOPHAGOGASTRODUODENOSCOPY (EGD);  Surgeon: [name], MD;  [facility];  Service: Endoscopy;  Laterality: N/A;  210  . ESOPHAGOGASTRODUODENOSCOPY N/A 02/11/2018   Procedure: ESOPHAGOGASTRODUODENOSCOPY (EGD);  Surgeon: [name], MD;  [facility];  Service: Endoscopy;  Laterality: N/A;  10:30  . POLYPECTOMY  02/11/2018   Procedure: POLYPECTOMY;  Surgeon: [name], MD;  [facility];  Service: Endoscopy;;  prepyloric(HSx1)    Review of Systems see HPI, all other systems reviewed and are negative     Objective:   Physical Exam  BP (!) 153/77   Pulse 79   Temp 98 F (36.7 C)   Ht 5\' 2"  (1.575 m)   Wt 110 lb (49.9 kg)   BMI 20.12 kg/m  General: 68 year old female ambulating with arthritis support shoes in no acute distress Eyes: Sclera nonicteric, conjunctiva pink Mouth: No ulcers or lesions Neck: Supple, no lymphadenopathy Heart: Regular rate and rhythm, no murmurs Lungs: Breath sounds clear throughout Abdomen: When the patient is laying supine and bears down a 6 to 7 cm bulge protrudes from the right lateral abdominal wall, when the patient stands she has a golf ball size right inguinal hernia which is reducible, positive bowel sounds to all 4 quadrants, no HSM Remedies: No edema Neuro: Alert and oriented x4, no focal deficits    Assessment & Plan:   1.  Right abdominal hernia with right inguinal hernia component -Repeat abdominal/pelvic CT -Schedule consult with a general surgeon in [place] per the patient's request  2. Colon cancer screening -next colonoscopy due August 2027

## 2018-11-22 ENCOUNTER — Encounter (INDEPENDENT_AMBULATORY_CARE_PROVIDER_SITE_OTHER): Payer: Self-pay | Admitting: *Deleted

## 2018-11-22 ENCOUNTER — Ambulatory Visit (INDEPENDENT_AMBULATORY_CARE_PROVIDER_SITE_OTHER): Payer: Medicare Other | Admitting: Nurse Practitioner

## 2018-11-22 ENCOUNTER — Encounter (INDEPENDENT_AMBULATORY_CARE_PROVIDER_SITE_OTHER): Payer: Self-pay | Admitting: Nurse Practitioner

## 2018-11-22 ENCOUNTER — Other Ambulatory Visit: Payer: Self-pay

## 2018-11-22 VITALS — BP 153/77 | HR 79 | Temp 98.0°F | Ht 62.0 in | Wt 110.0 lb

## 2018-11-22 DIAGNOSIS — K458 Other specified abdominal hernia without obstruction or gangrene: Secondary | ICD-10-CM

## 2018-11-22 DIAGNOSIS — K469 Unspecified abdominal hernia without obstruction or gangrene: Secondary | ICD-10-CM | POA: Insufficient documentation

## 2018-11-22 DIAGNOSIS — K4090 Unilateral inguinal hernia, without obstruction or gangrene, not specified as recurrent: Secondary | ICD-10-CM | POA: Diagnosis not present

## 2018-11-22 DIAGNOSIS — R1031 Right lower quadrant pain: Secondary | ICD-10-CM

## 2018-11-22 NOTE — Patient Instructions (Signed)
1. Complete  the provided lab order 2. Abdominal/pelvic CAT scan 3. Schedule a consult with a general surgeon 4.  Further  follow-up to be determined after the above evaluation completed  5. Call our office if your abdominal or groin pain worsens.  You develop severe abdominal pain, vomiting present to the local emergency room

## 2018-11-23 ENCOUNTER — Telehealth (INDEPENDENT_AMBULATORY_CARE_PROVIDER_SITE_OTHER): Payer: Self-pay | Admitting: Nurse Practitioner

## 2018-11-23 ENCOUNTER — Other Ambulatory Visit (INDEPENDENT_AMBULATORY_CARE_PROVIDER_SITE_OTHER): Payer: Self-pay | Admitting: Nurse Practitioner

## 2018-11-23 LAB — CBC WITH DIFFERENTIAL/PLATELET
Absolute Monocytes: 428 cells/uL (ref 200–950)
Basophils Absolute: 18 cells/uL (ref 0–200)
Basophils Relative: 0.4 %
Eosinophils Absolute: 149 cells/uL (ref 15–500)
Eosinophils Relative: 3.3 %
HCT: 33.1 % — ABNORMAL LOW (ref 35.0–45.0)
Hemoglobin: 11 g/dL — ABNORMAL LOW (ref 11.7–15.5)
Lymphs Abs: 689 cells/uL — ABNORMAL LOW (ref 850–3900)
MCH: 33.2 pg — ABNORMAL HIGH (ref 27.0–33.0)
MCHC: 33.2 g/dL (ref 32.0–36.0)
MCV: 100 fL (ref 80.0–100.0)
MPV: 10 fL (ref 7.5–12.5)
Monocytes Relative: 9.5 %
Neutro Abs: 3218 cells/uL (ref 1500–7800)
Neutrophils Relative %: 71.5 %
Platelets: 133 10*3/uL — ABNORMAL LOW (ref 140–400)
RBC: 3.31 10*6/uL — ABNORMAL LOW (ref 3.80–5.10)
RDW: 14.2 % (ref 11.0–15.0)
Total Lymphocyte: 15.3 %
WBC: 4.5 10*3/uL (ref 3.8–10.8)

## 2018-11-23 LAB — COMPLETE METABOLIC PANEL WITH GFR
AG Ratio: 1.6 (calc) (ref 1.0–2.5)
ALT: 11 U/L (ref 6–29)
AST: 19 U/L (ref 10–35)
Albumin: 4.1 g/dL (ref 3.6–5.1)
Alkaline phosphatase (APISO): 55 U/L (ref 37–153)
BUN: 22 mg/dL (ref 7–25)
CO2: 29 mmol/L (ref 20–32)
Calcium: 9.4 mg/dL (ref 8.6–10.4)
Chloride: 106 mmol/L (ref 98–110)
Creat: 0.73 mg/dL (ref 0.50–0.99)
GFR, Est African American: 99 mL/min/{1.73_m2} (ref >=60)
GFR, Est Non African American: 86 mL/min/{1.73_m2} (ref >=60)
Globulin: 2.6 g/dL (calc) (ref 1.9–3.7)
Glucose, Bld: 83 mg/dL (ref 65–139)
Potassium: 4.2 mmol/L (ref 3.5–5.3)
Sodium: 142 mmol/L (ref 135–146)
Total Bilirubin: 0.5 mg/dL (ref 0.2–1.2)
Total Protein: 6.7 g/dL (ref 6.1–8.1)

## 2018-11-23 LAB — C-REACTIVE PROTEIN: CRP: 1.8 mg/L (ref <8.0)

## 2018-11-23 NOTE — Telephone Encounter (Signed)
Ann pls facilitate consult with general surgeon  Dr. Herschell Dimes in [place], [place]. Office # [phone] per patient preference to evaluate right abdominal and right inguinal hernia. Refer to office visit 10/26 if needed thx

## 2018-11-23 NOTE — Progress Notes (Signed)
See phone note

## 2018-11-24 NOTE — Telephone Encounter (Signed)
appt with Dr Audrie Lia sch'd 11/29/18, patient aware

## 2018-12-01 ENCOUNTER — Ambulatory Visit (INDEPENDENT_AMBULATORY_CARE_PROVIDER_SITE_OTHER): Payer: Medicare Other | Admitting: Nurse Practitioner

## 2018-12-02 ENCOUNTER — Other Ambulatory Visit: Payer: Self-pay

## 2018-12-02 ENCOUNTER — Ambulatory Visit (HOSPITAL_COMMUNITY)
Admission: RE | Admit: 2018-12-02 | Discharge: 2018-12-02 | Disposition: A | Discharge status: Home / Self Care | Payer: Medicare Other | Source: Ambulatory Visit | Attending: Nurse Practitioner | Admitting: Nurse Practitioner

## 2018-12-02 DIAGNOSIS — K458 Other specified abdominal hernia without obstruction or gangrene: Secondary | ICD-10-CM | POA: Insufficient documentation

## 2018-12-02 DIAGNOSIS — K4090 Unilateral inguinal hernia, without obstruction or gangrene, not specified as recurrent: Secondary | ICD-10-CM | POA: Diagnosis present

## 2018-12-02 DIAGNOSIS — R1031 Right lower quadrant pain: Secondary | ICD-10-CM | POA: Insufficient documentation

## 2018-12-02 MED ORDER — IOHEXOL 300 MG/ML  SOLN
75.0000 mL | Freq: Once | INTRAMUSCULAR | Status: AC | PRN
  Administered 2018-12-02: 75 mL via INTRAVENOUS

## 2018-12-08 ENCOUNTER — Ambulatory Visit (INDEPENDENT_AMBULATORY_CARE_PROVIDER_SITE_OTHER): Payer: Medicare Other | Admitting: Nurse Practitioner

## 2019-06-28 ENCOUNTER — Telehealth (INDEPENDENT_AMBULATORY_CARE_PROVIDER_SITE_OTHER): Payer: Self-pay | Admitting: Internal Medicine

## 2019-06-28 DIAGNOSIS — K219 Gastro-esophageal reflux disease without esophagitis: Secondary | ICD-10-CM

## 2019-06-28 MED ORDER — OMEPRAZOLE 40 MG PO CPDR: 40.0000 mg | DELAYED_RELEASE_CAPSULE | Freq: Every day | ORAL | 1 refills | Status: DC

## 2019-06-28 NOTE — Telephone Encounter (Signed)
Patient left voice mail message checking on her refill request for omeprazole - please advise - ph# [phone]

## 2019-06-28 NOTE — Telephone Encounter (Signed)
Patient was last seen in the office by [facility] on 11/22/2018. There were follow ups , but they were canceled. The request is for Omeprazole 40 mg - take 1 by mouth daily . #90 1 refill.  I feel that this has been addressed and patient would need to have OV prior to further refills or she may get form her PCP.  Please advise.

## 2019-06-28 NOTE — Telephone Encounter (Signed)
I sent refills to pharmacy for her since she has been seen in past year but will need f/up as recommended

## 2019-11-09 ENCOUNTER — Other Ambulatory Visit (INDEPENDENT_AMBULATORY_CARE_PROVIDER_SITE_OTHER): Payer: Self-pay | Admitting: Gastroenterology

## 2019-11-21 ENCOUNTER — Other Ambulatory Visit: Payer: Self-pay

## 2019-11-21 ENCOUNTER — Ambulatory Visit (INDEPENDENT_AMBULATORY_CARE_PROVIDER_SITE_OTHER): Payer: Medicare Other | Admitting: Gastroenterology

## 2019-11-21 ENCOUNTER — Encounter (INDEPENDENT_AMBULATORY_CARE_PROVIDER_SITE_OTHER): Payer: Self-pay | Admitting: Gastroenterology

## 2019-11-21 VITALS — BP 152/80 | HR 78 | Temp 97.7°F | Ht 62.0 in | Wt 97.7 lb

## 2019-11-21 DIAGNOSIS — K5909 Other constipation: Secondary | ICD-10-CM

## 2019-11-21 DIAGNOSIS — R634 Abnormal weight loss: Secondary | ICD-10-CM

## 2019-11-21 DIAGNOSIS — K219 Gastro-esophageal reflux disease without esophagitis: Secondary | ICD-10-CM

## 2019-11-21 NOTE — Patient Instructions (Signed)
We are checking labs today.  We will contact you with further recommendations.

## 2019-11-21 NOTE — Progress Notes (Addendum)
Patient profile: Analycia Escobar is a 69 y.o. female seen for follow up of GERD. Last seen 10/2018 for abd pain.   History of Present Illness: Jessica Escobar is seen today for follow up. After her last visit here she had a hernia repair in [place] Dec 2020 (right inguinal hernia)  Se reports some improvement in abd pain in right side - improved but not completely resolved following hernia repair. Pain only occurs some days (average 3x/week). Can occur without relation to food or be triggered by foods such as hotdogs, cantaloupe, and cucumbers. Described as a nagging discomfort. She denies any n/v with pain. Rare dysphagia due to dentition issues (trouble w/ bottom plate not fitting well and trouble chewing). She is not having esophageal dysphagia issues like she had in 2017.   She feels GERD is well controlled on current dose. No frequent reflux symptoms.   She takes miralax as needed - taking few times a week if feels bloated and that stomach is hard. She denies any blood in stool. She feels some improvement in RLQ pain when has a BM. She used to take stool softener routinely and feels this works better than Miralax PRN.   No alcohol or tobacco. Takes 3 naprosyn daily for arthritis (takes w/ food).  Weight loss discussed in detail - feels dental issues contributing to her weight loss but appetite is just fair. Taking 1 ensure a day to prevent further weight loss.   Wt Readings from Last 3 Encounters:  11/21/19 97 lb 11.2 oz (44.3 kg)  11/22/18 110 lb (49.9 kg)  02/11/18 114 lb (51.7 kg)  10/2017--weight was #119     Last Colonoscopy: 2017-- The entire examined colon is normal. - extrinsic from nodular area anteriorly felt to be cervix. found on digital rectal exam. - No specimens collected.   Last Endoscopy: 01/2018-- Normal proximal esophagus and mid esophagus. - Scar in the distal esophagus secondary to healed esophagitis. - Z-line irregular, 35 cm from the incisors. - No  endoscopic esophageal abnormality to explain patient's dysphagia. Esophagus dilated. - A single ulcerated gastric polyp. Resected and retrieved. - Normal duodenal bulb and second portion of the duodenum.   Past Medical History:  Past Medical History:  Diagnosis Date  . Collagen vascular disease (HCC)   . Connective tissue disease (HCC)   . GERD (gastroesophageal reflux disease)   . Hypertension   . Lupus (HCC)   . Pulmonary hypertension (HCC)   . RA (rheumatoid arthritis) (HCC)   . Raynaud disease     Problem List: Patient Active Problem List   Diagnosis Date Noted  . Abdominal hernia 11/22/2018  . Right inguinal hernia 11/22/2018  . Esophageal dysphagia 11/16/2017  . Gastroesophageal reflux disease without esophagitis 11/16/2017  . Rheumatoid arthritis (HCC) 09/27/2014  . Connective tissue disease (HCC) 09/27/2014  . Lupus (HCC) 09/27/2014  . Raynaud disease 09/27/2014  . Pulmonary hypertension (HCC) 09/27/2014  . Lung interstitial disease (HCC) 09/27/2014    Past Surgical History: Past Surgical History:  Procedure Laterality Date  . BILATERAL CARPAL TUNNEL RELEASE    . Broken femur rod    . CATARACT EXTRACTION     bilateral  . COLONOSCOPY N/A 08/29/2015   Procedure: COLONOSCOPY;  Surgeon: [name], MD;  [facility]: [facility];  Service: Endoscopy;  Laterality: N/A;  1030  . ESOPHAGEAL DILATION N/A 02/11/2018   Procedure: ESOPHAGEAL DILATION;  Surgeon: [name], MD;  Location: [facility];  Service: Endoscopy;  Laterality: N/A;  .  ESOPHAGOGASTRODUODENOSCOPY N/A 10/20/2014   Procedure: ESOPHAGOGASTRODUODENOSCOPY (EGD);  Surgeon: [name], MD;  Location: [facility];  Service: Endoscopy;  Laterality: N/A;  210  . ESOPHAGOGASTRODUODENOSCOPY N/A 02/11/2018   Procedure: ESOPHAGOGASTRODUODENOSCOPY (EGD);  Surgeon: [name], MD;  [facility]: [facility];  Service: Endoscopy;  Laterality: N/A;  10:30  . POLYPECTOMY  02/11/2018   Procedure:  POLYPECTOMY;  Surgeon: [name], MD;  Location: [facility];  Service: Endoscopy;;  prepyloric(HSx1)    Allergies: Allergies  Allergen Reactions  . Daypro [Oxaprozin] Other (See Comments)    Upset stomach  . Penicillins Hives, Rash and Other (See Comments)    DID THE REACTION INVOLVE: Swelling of the face/tongue/throat, SOB, or low BP? No Sudden or severe rash/hives, skin peeling, or the inside of the mouth or nose? No Did it require medical treatment? No When did it last happen? Long time ago, early 1980s If all above answers are "NO", may proceed with cephalosporin use       Home Medications:  Current Outpatient Medications:  .  calcium-vitamin D 250-100 MG-UNIT tablet, Take 1 tablet by mouth 3 (three) times daily., Disp: , Rfl:  .  carboxymethylcellulose (REFRESH PLUS) 0.5 % SOLN, 1 drop daily as needed., Disp: , Rfl:  .  conjugated estrogens (PREMARIN) vaginal cream, Place 1 Applicatorful vaginally 2 (two) times a week. , Disp: , Rfl:  .  cycloSPORINE, PF, 0.09 % SOLN, Place 1 drop into both eyes 2 (two) times daily., Disp: , Rfl:  .  diltiazem (CARDIZEM) 120 MG tablet, Take 120 mg by mouth daily., Disp: , Rfl:  .  estrogens, conjugated, (PREMARIN) 0.625 MG tablet, Take 0.625 mg by mouth daily. Take daily for 21 days then do not take for 7 days., Disp: , Rfl:  .  folic acid (FOLVITE) 1 MG tablet, Take 1 mg by mouth daily., Disp: , Rfl:  .  Lactobacillus (PROBIOTIC ACIDOPHILUS PO), Take 1 capsule by mouth daily with lunch., Disp: , Rfl:  .  methotrexate (RHEUMATREX) 2.5 MG tablet, Take 2.5 mg by mouth once a week. Caution:Chemotherapy. Protect from light. Takes 6 tabs every Monday, Disp: , Rfl:  .  Multiple Vitamin (MULTIVITAMIN WITH MINERALS) TABS tablet, Take 1 tablet by mouth daily with lunch., Disp: , Rfl:  .  naproxen sodium (ALEVE) 220 MG tablet, Take 1-2 tablets (220-440 mg total) by mouth See admin instructions. Take 1 tablet (220 mg) by mouth with lunch & take 2  tablets (440 mg) at night., Disp: , Rfl:  .  Omega-3 Fatty Acids (FISH OIL PO), Take 1 capsule by mouth 3 (three) times daily. Lunch & bedtime , Disp: , Rfl:  .  omeprazole (PRILOSEC) 40 MG capsule, Take 1 capsule (40 mg total) by mouth daily., Disp: 90 capsule, Rfl: 1 .  omeprazole (PRILOSEC) 40 MG capsule, TAKE 1 CAPSULE EVERY DAY, Disp: 90 capsule, Rfl: 1 .  oxybutynin (DITROPAN) 5 MG tablet, Take 5 mg by mouth 2 (two) times daily., Disp: , Rfl:  .  polyethylene glycol (MIRALAX / GLYCOLAX) 17 g packet, Take 17 g by mouth as needed., Disp: , Rfl:  .  Polyvinyl Alcohol-Povidone (REFRESH OP), Place 1 drop into both eyes every 2 (two) hours., Disp: , Rfl:  .  pravastatin (PRAVACHOL) 80 MG tablet, Take 80 mg by mouth daily with supper. , Disp: , Rfl:  .  sodium chloride (MURO 128) 2 % ophthalmic solution, Place 1 drop into both eyes daily., Disp: , Rfl:  .  sodium chloride (MURO 128) 5 % ophthalmic ointment, Place 1 application into both eyes at bedtime., Disp: , Rfl:  .  spironolactone (ALDACTONE) 25 MG tablet, Take 25 mg by mouth daily., Disp: , Rfl:  .  Tadalafil, PAH, (ADCIRCA) 20 MG TABS, Take 40 mg by mouth daily with lunch. , Disp: , Rfl:  .  vitamin C (ASCORBIC ACID) 500 MG tablet, Take 1,000 mg by mouth daily with lunch. , Disp: , Rfl:  .  zoledronic acid (RECLAST) 5 MG/100ML SOLN injection, Inject 5 mg into the vein once. Once a year will find out if she needs this in Dec 2021, Disp: , Rfl:  .  omeprazole (PRILOSEC) 40 MG capsule, Take 1 capsule (40 mg total) by mouth daily., Disp: 90 capsule, Rfl: 1 .  traMADol (ULTRAM) 50 MG tablet, Take 1 tablet (50 mg total) by mouth 3 (three) times daily as needed., Disp: 15 tablet, Rfl: 0   Family History: Denies family history of colon polyps or colon cancer.  Social History:   reports that she has quit smoking. She has quit using smokeless tobacco. She reports that she does not drink alcohol and does not use drugs.   Review of  Systems: Constitutional: Denies weight loss/weight gain  Eyes: No changes in vision. ENT: No oral lesions, sore throat.  GI: see HPI.  Heme/Lymph: No easy bruising.  CV: No chest pain.  GU: No hematuria.  Integumentary: No rashes.  Neuro: No headaches.  Psych: No depression/anxiety.  Endocrine: No heat/cold intolerance.  Allergic/Immunologic: No urticaria.  Resp: No cough, SOB.  Musculoskeletal: No joint swelling.    Physical Examination: BP (!) 152/80 (BP Location: Right Arm, Patient Position: Sitting, Cuff Size: Small)   Pulse 78   Temp 97.7 F (36.5 C) (Oral)   Ht 5\' 2"  (1.575 m)   Wt 97 lb 11.2 oz (44.3 kg)   BMI 17.87 kg/m  Gen: NAD, alert and oriented x 4 HEENT: PEERLA, EOMI, Neck: supple, no JVD Chest: CTA bilaterally, no wheezes, crackles, or other adventitious sounds CV: RRR, no m/g/c/r Abd: soft, NT, ND, +BS in all four quadrants; no HSM, guarding, ridigity, or rebound tenderness Ext: no edema, well perfused with 2+ pulses, Skin: no rash or lesions noted on observed skin Lymph: no noted LAD  Data Reviewed:   Last Labs in Epic 10/2018-Hgb 11.0, WBC 4.5, platelet 133. CMP normal. CRP normal.    CT a/p 12/02/2018-IMPRESSION: No acute findings within the abdomen or pelvis. Small hiatal hernia. Aortic Atherosclerosis (ICD10-I70.0).  06/2018-HIDA normal  04/2018-CT w/ contrast- IMPRESSION: 1. No acute abdominal or pelvic pathology. 2. Left renal cyst. 3.  Aortic Atherosclerosis (ICD10-I70.0). 4. Cardiomegaly   IMPRESSION: baium swallow 10/2017 Mild decreased esophageal motility Mild-to-moderate reflux without hiatal hernia Negative for stricture  03/2018 - TSH (normal), free T4 1.23 (minimally elevated)  Assessment/Plan: Ms. Eilertson is a 69 y.o. female    Jaymarie was seen today for right lower quad pain.  Diagnoses and all orders for this visit:  Chronic GERD -     TSH + free T4 -     COMPLETE METABOLIC PANEL WITH GFR -     CBC with  Differential  Chronic constipation -     TSH + free T4 -     COMPLETE METABOLIC PANEL WITH GFR -     CBC with Differential  Loss of weight -     TSH + free T4 -     COMPLETE METABOLIC PANEL WITH GFR -  CBC with Differential     1. GERD-seen for yearly OV and PPI refilled (omperazole 40mg  once a day). No frequent symptoms. Feels dysphagia is due to dentition issues. Last EGD 01/2018  2. Constipation - improves w/ miralax or stool softeners which reviewed she can use routinely to prevent constipation. Last colonoscopy 2017.   3. RLQ pain - improved after right inguinal hernia repair but still has some pain after food triggers or w/ constipation. She uses miralax which helps. Non tender on exam.  4. Wt loss - pt feels it is mainly due to not being able to chew foods well. Last TSH was 01/2018-will repeat today with other basic labs. Concered about this - BMI now 17.87. Had CTa/p 11/2018. Based on labs would consider chest imaging, repeat colonoscopy, etc if continues to loose weight to exclude other etiologies.     I personally performed the service, non-incident to. (WP)  12/2018, [facility]

## 2019-11-22 LAB — COMPLETE METABOLIC PANEL WITH GFR
AG Ratio: 1.2 (calc) (ref 1.0–2.5)
ALT: 13 U/L (ref 6–29)
AST: 22 U/L (ref 10–35)
Albumin: 4.2 g/dL (ref 3.6–5.1)
Alkaline phosphatase (APISO): 52 U/L (ref 37–153)
BUN/Creatinine Ratio: 35 (calc) — ABNORMAL HIGH (ref 6–22)
BUN: 29 mg/dL — ABNORMAL HIGH (ref 7–25)
CO2: 28 mmol/L (ref 20–32)
Calcium: 9.7 mg/dL (ref 8.6–10.4)
Chloride: 104 mmol/L (ref 98–110)
Creat: 0.84 mg/dL (ref 0.50–0.99)
GFR, Est African American: 83 mL/min/{1.73_m2} (ref >=60)
GFR, Est Non African American: 72 mL/min/{1.73_m2} (ref >=60)
Globulin: 3.6 g/dL (calc) (ref 1.9–3.7)
Glucose, Bld: 74 mg/dL (ref 65–139)
Potassium: 4.7 mmol/L (ref 3.5–5.3)
Sodium: 140 mmol/L (ref 135–146)
Total Bilirubin: 0.6 mg/dL (ref 0.2–1.2)
Total Protein: 7.8 g/dL (ref 6.1–8.1)

## 2019-11-22 LAB — CBC WITH DIFFERENTIAL/PLATELET
Absolute Monocytes: 288 cells/uL (ref 200–950)
Basophils Absolute: 20 cells/uL (ref 0–200)
Basophils Relative: 0.5 %
Eosinophils Absolute: 108 cells/uL (ref 15–500)
Eosinophils Relative: 2.7 %
HCT: 36.6 % (ref 35.0–45.0)
Hemoglobin: 12.1 g/dL (ref 11.7–15.5)
Lymphs Abs: 616 cells/uL — ABNORMAL LOW (ref 850–3900)
MCH: 33.1 pg — ABNORMAL HIGH (ref 27.0–33.0)
MCHC: 33.1 g/dL (ref 32.0–36.0)
MCV: 100 fL (ref 80.0–100.0)
MPV: 10 fL (ref 7.5–12.5)
Monocytes Relative: 7.2 %
Neutro Abs: 2968 cells/uL (ref 1500–7800)
Neutrophils Relative %: 74.2 %
Platelets: 142 10*3/uL (ref 140–400)
RBC: 3.66 10*6/uL — ABNORMAL LOW (ref 3.80–5.10)
RDW: 15.6 % — ABNORMAL HIGH (ref 11.0–15.0)
Total Lymphocyte: 15.4 %
WBC: 4 10*3/uL (ref 3.8–10.8)

## 2019-11-22 LAB — TSH+FREE T4: TSH W/REFLEX TO FT4: 2.22 mIU/L (ref 0.40–4.50)

## 2019-12-05 ENCOUNTER — Ambulatory Visit: Payer: Medicare Other | Admitting: Physician Assistant

## 2020-02-24 IMAGING — CT CT ABDOMEN AND PELVIS WITH CONTRAST
3 of 6 series · 16 of 46 positions shown, 18 images · IV contrast (Isovue)
Comparison: None.

CLINICAL DATA: Abdominal pain all over abdomen x 1 year, abnormal
weight loss-approximately 17 pounds since [REDACTED]. Hx lupus, HTN,
RA, polypectomy, pessary device in bladder.

EXAM:
CT ABDOMEN AND PELVIS WITH CONTRAST
TECHNIQUE: Multidetector CT imaging of the abdomen and pelvis was performed
using the standard protocol following bolus administration of
intravenous contrast.
CONTRAST:  100mL OMNIPAQUE IOHEXOL 300 MG/ML  SOLN

[Series 2: axial st · axial · 0.65mm/px · z∈[+903,+1238]mm · 10 of 83 slices shown, 12 images]
[im 8/83  soft-tissue]
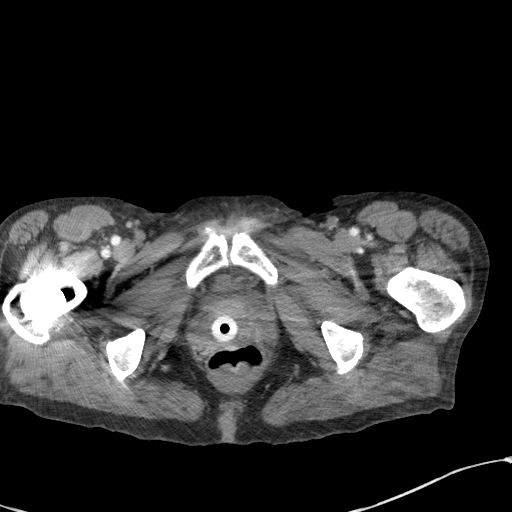
[im 8/83  bone]
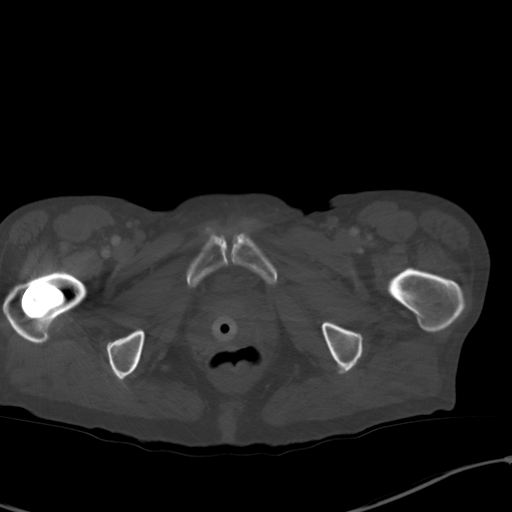
[im 15/83  soft-tissue]
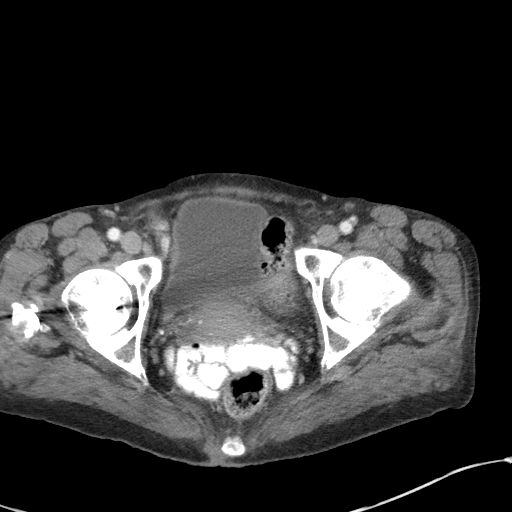
[im 23/83  soft-tissue]
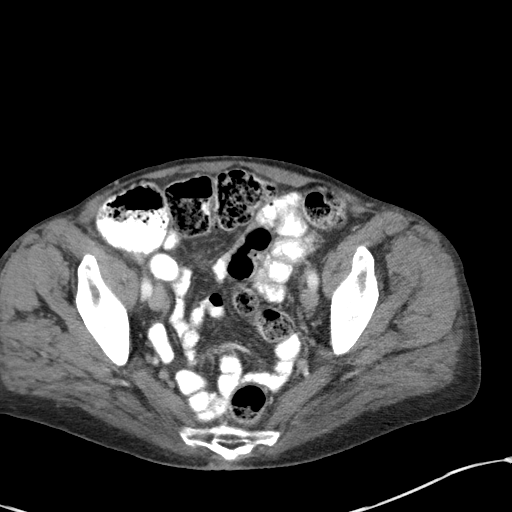
[im 30/83  soft-tissue]
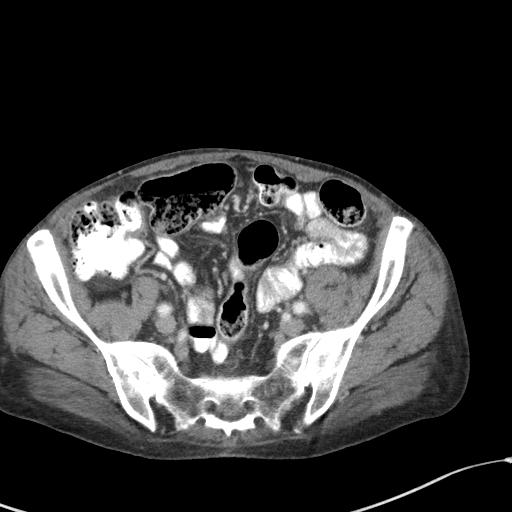
[im 38/83  soft-tissue]
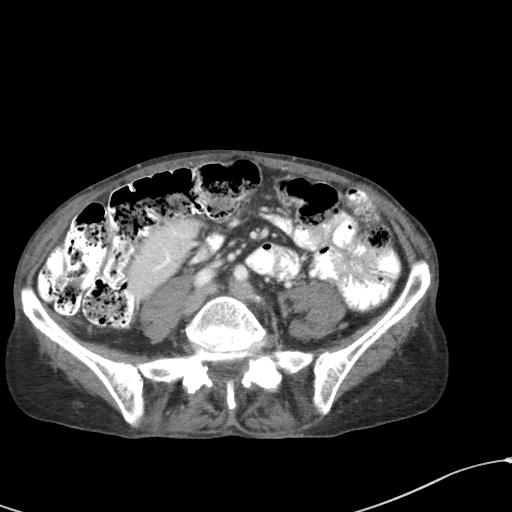
[im 45/83  soft-tissue]
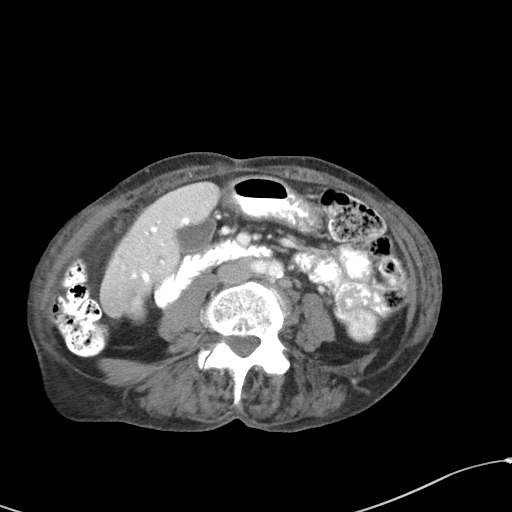
[im 53/83  soft-tissue]
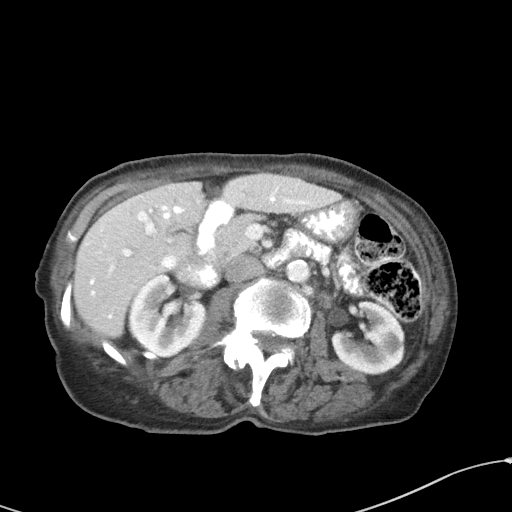
[im 60/83  soft-tissue]
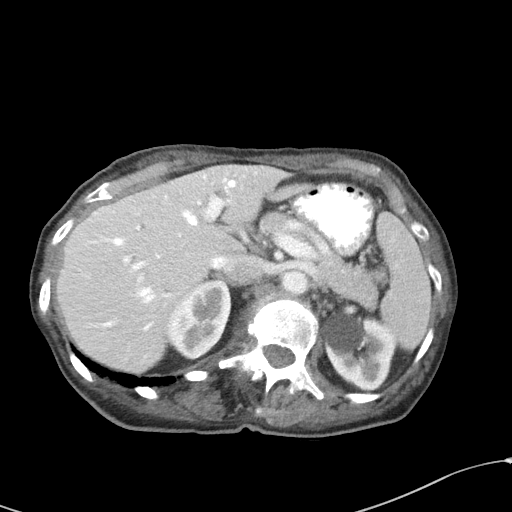
[im 68/83  soft-tissue]
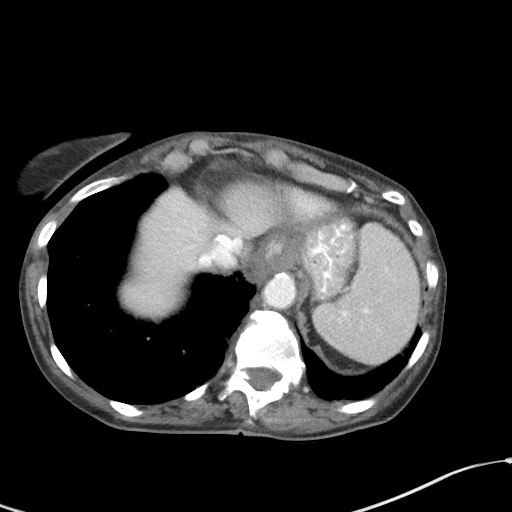
[im 68/83  bone]
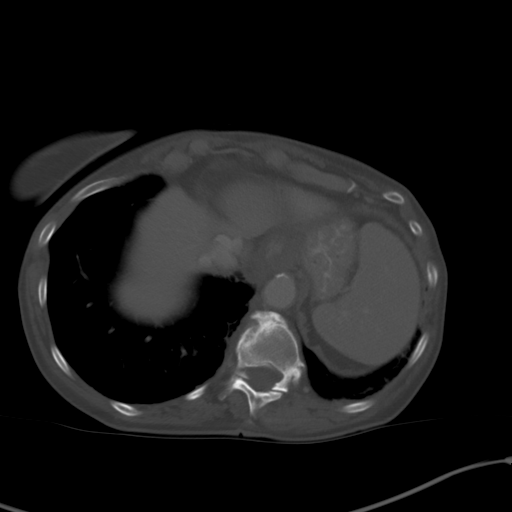
[im 75/83  soft-tissue]
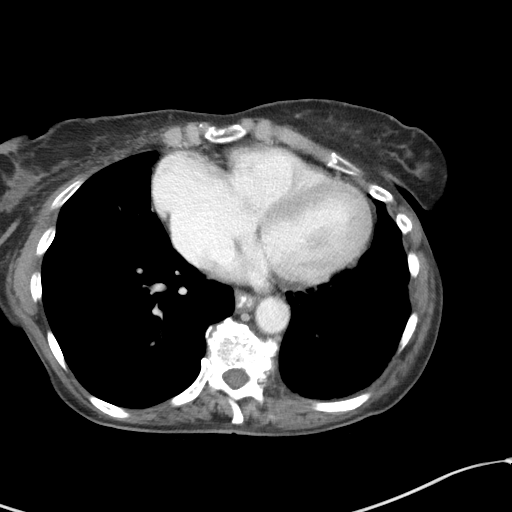

[Series 6: lung bases · axial · 0.65mm/px · z∈[+1132,+1172]mm · 3 of 80 slices shown]
[im 7/80  bone]
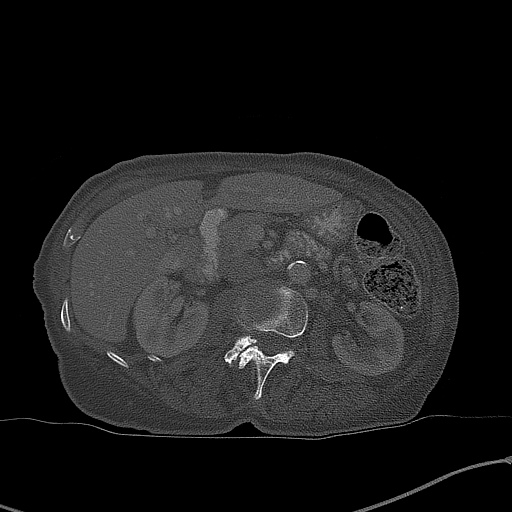
[im 20/80  bone]
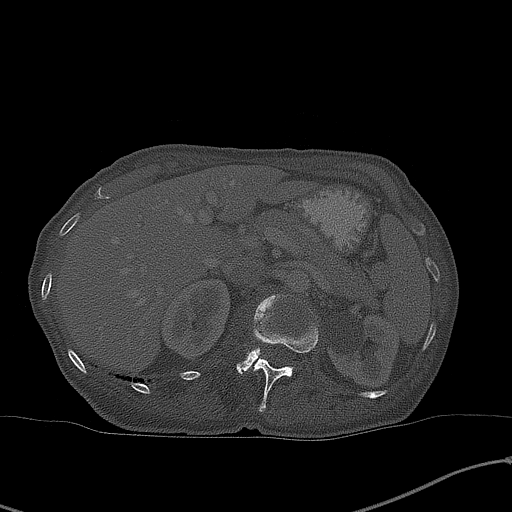
[im 27/80  bone]
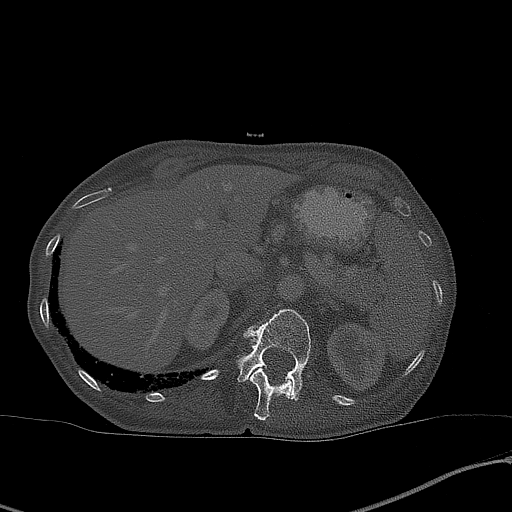

[Series 7: coronal st · coronal · 0.72mm/px · 3 of 83 slices shown]
[im 28/83  soft-tissue]
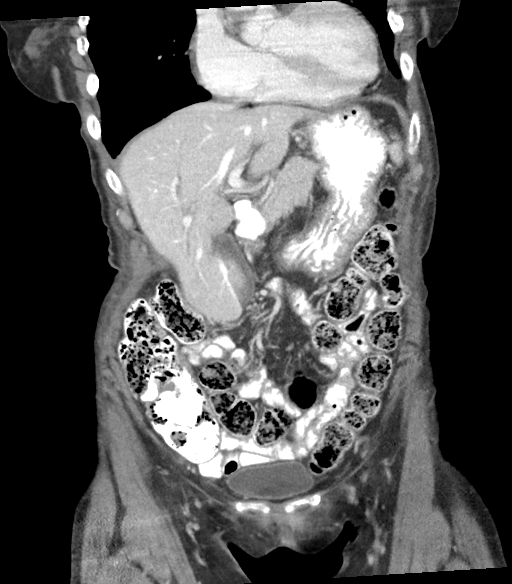
[im 37/83  soft-tissue]
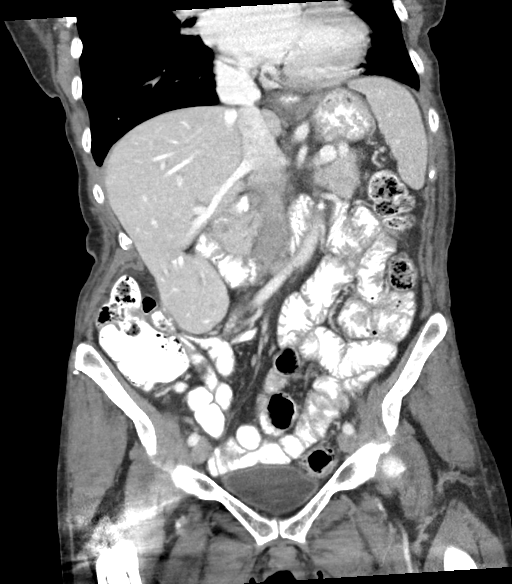
[im 46/83  soft-tissue]
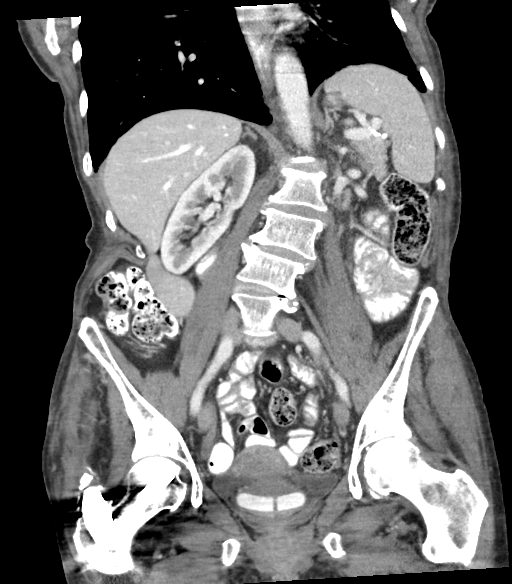

[16 of 46 positions shown; findings below may reference images not displayed]

FINDINGS: Lower chest: No acute abnormality.  Cardiomegaly.

Hepatobiliary: No focal liver abnormality is seen. No gallstones,
gallbladder wall thickening, or biliary dilatation.

Pancreas: Unremarkable. No pancreatic ductal dilatation or
surrounding inflammatory changes.

Spleen: 8 mm hypodense lesion in the superior aspect of the spleen
too small to characterize likely reflecting a small cyst. 5 mm
hypodensity in the spleen which fills in on delayed images likely
reflecting a small hemangioma.

Adrenals/Urinary Tract: Normal adrenal glands. No urolithiasis or
obstructive uropathy. 2.7 cm hypodense, fluid attenuating left renal
mass most consistent with a cyst. Bladder is unremarkable.

Stomach/Bowel: Stomach is within normal limits. Appendix appears
normal. No evidence of bowel wall thickening, distention, or
inflammatory changes. Large amount of stool throughout the colon.

Vascular/Lymphatic: Normal caliber abdominal aorta with mild
atherosclerosis. No lymphadenopathy.

Reproductive: Uterus and bilateral adnexa are unremarkable. Pessary
device is noted.

Other: No abdominal wall hernia or abnormality. No abdominopelvic
ascites.

Musculoskeletal: No acute osseous abnormality. No aggressive osseous
lesion. Orthopedic hardware in the right proximal femur from prior
ORIF. Severe levoscoliosis of the thoracolumbar spine.
IMPRESSION: 1. No acute abdominal or pelvic pathology.
2. Left renal cyst.
3.  Aortic Atherosclerosis ([id number]).
4. Cardiomegaly

## 2020-02-27 ENCOUNTER — Other Ambulatory Visit (INDEPENDENT_AMBULATORY_CARE_PROVIDER_SITE_OTHER): Payer: Self-pay

## 2020-02-27 DIAGNOSIS — K219 Gastro-esophageal reflux disease without esophagitis: Secondary | ICD-10-CM

## 2020-02-27 DIAGNOSIS — R1031 Right lower quadrant pain: Secondary | ICD-10-CM

## 2020-02-27 MED ORDER — OMEPRAZOLE 40 MG PO CPDR: 40.0000 mg | DELAYED_RELEASE_CAPSULE | Freq: Every day | ORAL | 3 refills | Status: DC

## 2020-12-10 ENCOUNTER — Encounter (INDEPENDENT_AMBULATORY_CARE_PROVIDER_SITE_OTHER): Payer: Self-pay | Admitting: Internal Medicine

## 2021-01-01 ENCOUNTER — Ambulatory Visit (INDEPENDENT_AMBULATORY_CARE_PROVIDER_SITE_OTHER): Payer: Medicare Other | Admitting: Internal Medicine

## 2021-02-05 ENCOUNTER — Encounter (INDEPENDENT_AMBULATORY_CARE_PROVIDER_SITE_OTHER): Payer: Self-pay | Admitting: Internal Medicine

## 2021-03-29 ENCOUNTER — Other Ambulatory Visit (INDEPENDENT_AMBULATORY_CARE_PROVIDER_SITE_OTHER): Payer: Self-pay | Admitting: Internal Medicine

## 2021-03-29 DIAGNOSIS — K219 Gastro-esophageal reflux disease without esophagitis: Secondary | ICD-10-CM

## 2021-03-29 DIAGNOSIS — R1031 Right lower quadrant pain: Secondary | ICD-10-CM

## 2021-04-02 ENCOUNTER — Ambulatory Visit (INDEPENDENT_AMBULATORY_CARE_PROVIDER_SITE_OTHER): Payer: Medicare Other | Admitting: Internal Medicine

## 2021-07-09 ENCOUNTER — Ambulatory Visit (INDEPENDENT_AMBULATORY_CARE_PROVIDER_SITE_OTHER): Payer: Medicare Other | Admitting: Internal Medicine

## 2021-07-09 ENCOUNTER — Encounter (INDEPENDENT_AMBULATORY_CARE_PROVIDER_SITE_OTHER): Payer: Self-pay | Admitting: Internal Medicine

## 2021-07-09 VITALS — BP 112/50 | HR 71 | Temp 98.9°F | Ht 62.0 in | Wt 101.2 lb

## 2021-07-09 DIAGNOSIS — R1319 Other dysphagia: Secondary | ICD-10-CM

## 2021-07-09 DIAGNOSIS — D649 Anemia, unspecified: Secondary | ICD-10-CM | POA: Diagnosis not present

## 2021-07-09 DIAGNOSIS — R1031 Right lower quadrant pain: Secondary | ICD-10-CM | POA: Diagnosis not present

## 2021-07-09 DIAGNOSIS — K219 Gastro-esophageal reflux disease without esophagitis: Secondary | ICD-10-CM | POA: Diagnosis not present

## 2021-07-09 MED ORDER — OMEPRAZOLE 40 MG PO CPDR: 40.0000 mg | DELAYED_RELEASE_CAPSULE | Freq: Every day | ORAL | 3 refills | Status: DC

## 2021-07-09 NOTE — Progress Notes (Signed)
Presenting complaint;  Follow-up for chronic GERD and dysphagia.  Database and subjective:  Patient is 71 year old Caucasian female who is here for scheduled visit.  She was last seen in the office in October 2021.  She has chronic GERD history of esophageal dysphagia felt to be due to motility disorder given underlying connective tissue disease and she also history of constipation and weight loss.  Patient states she began to have dysphagia in November when she was having difficulty with solids.  After few weeks she stopped having dysphagia of frequently and now she may have an episode every few weeks or couple months.  She underwent esophagogastroduodenoscopy with esophageal dilation back in January 2020.  She did not have esophageal stricture ring or web but she did respond to esophageal dilation.  She states she did fine until November last year.  She states heartburn is well controlled with therapy.  She may have heartburn at night but she eats ice cream every night.  She generally eats 2 meals a day and and a snack.  She says she has a slow eater.  She spends 1 hour at each meal.  She uses upper dentures.  She does not use lower dentures because they do not fit.  She says her appetite is fair.  She used to drink boost but not anymore.  She states 6 months ago she weighed 96 pounds.  She has been trying to eat more.  She has gained 5 pounds since then.  Her weight in October 2021 was 97 pounds. She states bowels move every day as long as she is takes polyethylene glycol.  She denies melena or rectal bleeding. Patient states she was diagnosed with rheumatoid arthritis in 1982.  [facility] but he is retired.  She will be seeing another rheumatologist in August 2023.  She takes methotrexate 20 mg every week.  Last screening colonoscopy was in August 2017 and was within normal limits.  Current Medications: Outpatient Encounter Medications as of 07/09/2021  Medication Sig    albuterol (VENTOLIN HFA) 108 (90 Base) MCG/ACT inhaler Inhale into the lungs every 6 (six) hours as needed for wheezing or shortness of breath.   calcium-vitamin D 250-100 MG-UNIT tablet Take 1 tablet by mouth 3 (three) times daily.   carboxymethylcellulose (REFRESH PLUS) 0.5 % SOLN 1 drop daily as needed.   cyanocobalamin 1000 MCG tablet Take 1,000 mcg by mouth daily.   cycloSPORINE, PF, 0.09 % SOLN Place 1 drop into both eyes 2 (two) times daily.   diltiazem (CARDIZEM) 120 MG tablet Take 120 mg by mouth daily.   folic acid (FOLVITE) 1 MG tablet Take 1 mg by mouth daily.   furosemide (LASIX) 20 MG tablet Take 20 mg by mouth.  1-2 a day   Lactobacillus (PROBIOTIC ACIDOPHILUS PO) Take 1 capsule by mouth daily with lunch.   levocetirizine (XYZAL) 5 MG tablet Take 5 mg by mouth every evening.   methotrexate (RHEUMATREX) 2.5 MG tablet Take 2.5 mg by mouth once a week. Caution:Chemotherapy. Protect from light. Takes 5 tablets bid once a week   Multiple Vitamin (MULTIVITAMIN WITH MINERALS) TABS tablet Take 1 tablet by mouth daily with lunch.   naproxen sodium (ALEVE) 220 MG tablet Take 1-2 tablets (220-440 mg total) by mouth See admin instructions. Take 1 tablet (220 mg) by mouth with lunch & take 2 tablets (440 mg) at night.   Omega-3 Fatty Acids (FISH OIL PO) Take 1 capsule by mouth 3 (three) times  daily. Lunch & bedtime    omeprazole (PRILOSEC) 40 MG capsule TAKE 1 CAPSULE EVERY DAY   oxybutynin (DITROPAN) 5 MG tablet Take 5 mg by mouth 2 (two) times daily.   polyethylene glycol (MIRALAX / GLYCOLAX) 17 g packet Take 17 g by mouth as needed.   pravastatin (PRAVACHOL) 40 MG tablet Take 40 mg by mouth daily with supper.   sodium chloride (MURO 128) 2 % ophthalmic solution Place 1 drop into both eyes daily.   sodium chloride (MURO 128) 5 % ophthalmic ointment Place 1 application into both eyes at bedtime.   spironolactone (ALDACTONE) 25 MG tablet Take 25 mg by mouth daily.   Tadalafil, PAH, (ADCIRCA)  20 MG TABS Take 40 mg by mouth daily with lunch.    vitamin C (ASCORBIC ACID) 500 MG tablet Take 1,000 mg by mouth daily with lunch.    [DISCONTINUED] methotrexate (RHEUMATREX) 2.5 MG tablet Take by mouth.   Polyvinyl Alcohol-Povidone (REFRESH OP) Place 1 drop into both eyes every 2 (two) hours.   [DISCONTINUED] conjugated estrogens (PREMARIN) vaginal cream Place 1 Applicatorful vaginally 2 (two) times a week.    [DISCONTINUED] estrogens, conjugated, (PREMARIN) 0.625 MG tablet Take 0.625 mg by mouth daily. Take daily for 21 days then do not take for 7 days.   [DISCONTINUED] traMADol (ULTRAM) 50 MG tablet Take 1 tablet (50 mg total) by mouth 3 (three) times daily as needed.   [DISCONTINUED] zoledronic acid (RECLAST) 5 MG/100ML SOLN injection Inject 5 mg into the vein once. Once a year will find out if she needs this in Dec 2021   No facility-administered encounter medications on file as of 07/09/2021.     Objective: Blood pressure (!) 112/50, pulse 71, temperature 98.9 F (37.2 C), temperature source Oral, height 5\' 2"  (1.575 m), weight 101 lb 3.2 oz (45.9 kg). Patient is alert and in no acute distress. She is on nasal O2. Conjunctiva is pink. Sclera is nonicteric Oropharyngeal mucosa is normal. She has upper dentures and edentulous in lower jaw. No neck masses or thyromegaly noted. Cardiac exam with regular rhythm normal S1 and S2. No murmur or gallop noted. Auscultation lungs reveal diminished intensity of breath sounds bilaterally.  No rales or rhonchi. Abdomen is soft and nontender with organomegaly or masses. Trace edema around ankles.  Labs/studies Results:      Latest Ref Rng & Units 11/21/2019    2:14 PM 11/22/2018   12:19 PM 05/17/2018   10:49 AM  CBC  WBC 3.8 - 10.8 Thousand/uL 4.0  4.5  5.6   Hemoglobin 11.7 - 15.5 g/dL 83.3  38.3  29.1   Hematocrit 35.0 - 45.0 % 36.6  33.1  38.3   Platelets 140 - 400 Thousand/uL 142  133  166        Latest Ref Rng & Units 11/21/2019     2:14 PM 11/22/2018   12:19 PM 05/17/2018   10:49 AM  CMP  Glucose 65 - 139 mg/dL 74  83  80   BUN 7 - 25 mg/dL 29  22  18    Creatinine 0.50 - 0.99 mg/dL 9.16  6.06  0.04   Sodium 135 - 146 mmol/L 140  142  139   Potassium 3.5 - 5.3 mmol/L 4.7  4.2  3.9   Chloride 98 - 110 mmol/L 104  106  101   CO2 20 - 32 mmol/L 28  29  29    Calcium 8.6 - 10.4 mg/dL 9.7  9.4  9.4  Total Protein 6.1 - 8.1 g/dL 7.8  6.7  7.4   Total Bilirubin 0.2 - 1.2 mg/dL 0.6  0.5  0.5   AST 10 - 35 U/L ALT 6 - 29 U/L Latest Ref Rng & Units 11/21/2019    2:14 PM 11/22/2018   12:19 PM 05/17/2018   10:49 AM  Hepatic Function  Total Protein 6.1 - 8.1 g/dL 7.8  6.7  7.4   AST 10 - 35 U/L ALT 6 - 29 U/L Total Bilirubin 0.2 - 1.2 mg/dL 0.6  0.5  0.5     Lab data from 06/07/2021 WBC 3.5, H&H 9.4 and 29.3 Platelet count 80 MCV 103.  Serum sodium 140, potassium 4.6, chloride 106, CO2 28, BUN 32, creatinine 1.0 Glucose 85 Serum calcium 9.3 Bilirubin 0.4, AP 44, AST 26, ALT 18, total protein 6.9 and albumin 3.7  Serum folate greater than 22 on 07/03/2020  Serum B12 368 on 09/06/2019.  Assessment:  #1.  Esophageal dysphagia.  She did respond to esophageal dilation back in January 2020 even though no structural abnormality was noted.  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  She had a spell in November and December 2022 where she had dysphagia frequently.  I wonder if she developed esophagitis which has resolved and frequency of dysphagia has decreased significantly.  Given her symptoms would like to proceed with barium study to make sure she has not developed a stricture in which case dilation would be indicated.  #2.  Chronic GERD.  Heartburn is reasonably well controlled with omeprazole.  Nocturnal heartburn may be because she eats ice cream night.  He can use OTC  antacids on as-needed basis.  #3.  Anemia.  Hemoglobin 1 month ago was 9.4 g.  Her MCV is mildly elevated.  Folate and B12 levels previously have been normal.  I wonder if she has iron deficiency anemia given that she has been on chronic PPI therapy for years. Finally patient is on Naprosyn and therefore at risk for peptic ulcer disease although she is somewhat protected because she is on PPI.  No history of melena or rectal bleeding.   Plan:  New prescription given for omeprazole 40 mg by mouth 30 minutes before breakfast daily.  90 doses with 3 refills. Barium pill esophagogram. CBC, serum iron TIBC and ferritin.  Patient would like to wait and get this blood work with her schedule blood work and 6 to 8 weeks. Office visit in 6 months.

## 2021-07-09 NOTE — Patient Instructions (Signed)
Physician or office will call with results

## 2021-07-19 ENCOUNTER — Other Ambulatory Visit (INDEPENDENT_AMBULATORY_CARE_PROVIDER_SITE_OTHER): Payer: Self-pay | Admitting: Internal Medicine

## 2021-07-19 ENCOUNTER — Ambulatory Visit (HOSPITAL_COMMUNITY)
Admission: RE | Admit: 2021-07-19 | Discharge: 2021-07-19 | Disposition: A | Discharge status: Home / Self Care | Payer: Medicare Other | Source: Ambulatory Visit | Attending: Internal Medicine | Admitting: Internal Medicine

## 2021-07-19 DIAGNOSIS — R1319 Other dysphagia: Secondary | ICD-10-CM

## 2021-07-20 LAB — CBC WITH DIFFERENTIAL/PLATELET
Absolute Monocytes: 217 cells/uL (ref 200–950)
Basophils Absolute: 19 cells/uL (ref 0–200)
Basophils Relative: 0.3 %
Eosinophils Absolute: 130 cells/uL (ref 15–500)
Eosinophils Relative: 2.1 %
HCT: 29.9 % — ABNORMAL LOW (ref 35.0–45.0)
Hemoglobin: 10 g/dL — ABNORMAL LOW (ref 11.7–15.5)
Lymphs Abs: 639 cells/uL — ABNORMAL LOW (ref 850–3900)
MCH: 34.6 pg — ABNORMAL HIGH (ref 27.0–33.0)
MCHC: 33.4 g/dL (ref 32.0–36.0)
MCV: 103.5 fL — ABNORMAL HIGH (ref 80.0–100.0)
MPV: 10.6 fL (ref 7.5–12.5)
Monocytes Relative: 3.5 %
Neutro Abs: 5196 cells/uL (ref 1500–7800)
Neutrophils Relative %: 83.8 %
Platelets: 111 10*3/uL — ABNORMAL LOW (ref 140–400)
RBC: 2.89 10*6/uL — ABNORMAL LOW (ref 3.80–5.10)
RDW: 15.5 % — ABNORMAL HIGH (ref 11.0–15.0)
Total Lymphocyte: 10.3 %
WBC: 6.2 10*3/uL (ref 3.8–10.8)

## 2021-07-20 LAB — IRON,TIBC AND FERRITIN PANEL
%SAT: 33 % (calc) (ref 16–45)
Ferritin: 74 ng/mL (ref 16–288)
Iron: 95 ug/dL (ref 45–160)
TIBC: 285 mcg/dL (calc) (ref 250–450)

## 2021-12-26 ENCOUNTER — Encounter (INDEPENDENT_AMBULATORY_CARE_PROVIDER_SITE_OTHER): Payer: Self-pay | Admitting: Gastroenterology

## 2022-01-16 ENCOUNTER — Ambulatory Visit (INDEPENDENT_AMBULATORY_CARE_PROVIDER_SITE_OTHER): Payer: Medicare Other | Admitting: Gastroenterology

## 2022-02-27 ENCOUNTER — Ambulatory Visit (INDEPENDENT_AMBULATORY_CARE_PROVIDER_SITE_OTHER): Payer: Medicare Other | Admitting: Gastroenterology

## 2022-03-27 ENCOUNTER — Ambulatory Visit (INDEPENDENT_AMBULATORY_CARE_PROVIDER_SITE_OTHER): Payer: Medicare Other | Admitting: Gastroenterology

## 2022-05-22 ENCOUNTER — Encounter (INDEPENDENT_AMBULATORY_CARE_PROVIDER_SITE_OTHER): Payer: Self-pay | Admitting: Gastroenterology

## 2022-05-22 ENCOUNTER — Ambulatory Visit (INDEPENDENT_AMBULATORY_CARE_PROVIDER_SITE_OTHER): Payer: Medicare Other | Admitting: Gastroenterology

## 2022-05-22 VITALS — BP 125/65 | HR 69 | Ht 62.0 in | Wt 98.9 lb

## 2022-05-22 DIAGNOSIS — R195 Other fecal abnormalities: Secondary | ICD-10-CM

## 2022-05-22 DIAGNOSIS — R1319 Other dysphagia: Secondary | ICD-10-CM | POA: Diagnosis not present

## 2022-05-22 DIAGNOSIS — K219 Gastro-esophageal reflux disease without esophagitis: Secondary | ICD-10-CM | POA: Diagnosis not present

## 2022-05-22 MED ORDER — PANTOPRAZOLE SODIUM 40 MG PO TBEC: 40.0000 mg | DELAYED_RELEASE_TABLET | Freq: Every day | ORAL | 2 refills | Status: DC

## 2022-05-22 NOTE — Patient Instructions (Signed)
Please stop omeprazole We will start protonix  once daily, please take this for the next few weeks and let me know if symptoms are not improved You can try doing dietary changes with avoiding breads, cutting meats into very small/fine bites, make sure you are chewing thoroughly and taking sips of liquids between bites. If you do not feel your swallowing is improving, or you decide you want to go ahead with the upper endoscopy please let me know. Please also consider colonoscopy for changes in your stools.  Follow up 3 months

## 2022-05-22 NOTE — Progress Notes (Addendum)
Referring Provider: Renaldo Harrison, DO Primary Care Physician:  Renaldo Harrison, DO Primary GI Physician: Dr. Levon Hedger   Chief Complaint  Patient presents with   Gastroesophageal Reflux    Follow up on GERD. Takes prilosec in the mornings and has to take maalox daily as well.    Anemia    Follow up on anemia. Has not noticed any blood in stool.    HPI:   Jessica Escobar is a 72 y.o. female with past medical history of collagen vascular/tissue disorder, GERD, HTN, Lupuse, Pulmonary HTN, RA Raunauds disease.   Patient presenting today for follow up of GERD and anemia.  Last seen June 2023, at that time heartburn well controlled with therapy. She is taking miralax for constipation with good results. She had some ongoing esophageal dysphagia, thought secondary to dysmotility. She had recent labs with hgb 9.4, folate, B12 WNL  Recommended to continue with omeprazole, barium study, iron studies   Iron studies done in June with iron 95, TIBC 285, sat 33% ferritin 74, hgb 10, query if anemia secondary to methotrexate, advised to follow up with PCP/Rheumatologist on this.  Present:  She notes that she is taking omeprazole 40mg  in the morning though now requiring maalox almost nightly due to acid regurgitation she is having. She has been doing this for some time. She has been on omeprazole for some time.  She continues to have issues with dysphagia and foods hanging up. She notes that previously she had worse dysphagia that improved after esophageal dilation in 2020. Dysphagia is occurring about 2-3 days per week. She feels that food will sit in upper chest. She can sit for a few minutes and food will eventually pass and she can eat the rest of her food. She notes breads and meats tend to be worse for her, noting she has been eating these foods more lately.   She has been maintained on supplemental O2 since early last year, she is on 2L during the day and wears 4L at night.   She denies any  rectal bleeding or melena. Has not had any labs recently. Last Iron studies in June 2023 were WNL. Hgb at that time as 10. Notably, her MCV was 103.5, she is maintained on B12 and folic acid supplementation   She has a BM almost daily. Stools are formed but is having some intermittent skinnier stools the width of an ink pen, she notes that even when she has larger sized stools, they are still more narrow like the width of a cigar.  She notes that stools are usually fairly soft. She thinks this is going on for about 1 year. She takes miralax PRN. She drinks about 24 oz of water per day. Weight is stable, she cannot seem to gain past 100 lbs. Appetite is pretty good, she only does 2 meals per day as she sleeps late. She is doing ensure plus once daily.   Barium esophagram: 07/19/21 Age-related esophageal dysmotility. Small sliding hiatal hernia with prominent GE reflux seen during exam to the level of the carina. No evidence of esophageal mass or stricture. Last Colonoscopy:2017 - The entire examined colon is normal. - extrinsic from nodular area anteriorly felt to be cervix. found on digital rectal exam. - No specimens collected. Last Endoscopy: 01/2018- Normal proximal esophagus and mid esophagus. - Scar in the distal esophagus secondary to healed esophagitis. - Z-line irregular, 35 cm from the incisors. - No endoscopic esophageal abnormality to explain patient's dysphagia. Esophagus dilated. - A  single ulcerated gastric polyp.  - Normal duodenal bulb and second portion of the duodenum.  Recommendations:  Repeat colonoscopy 10  years   Past Medical History:  Diagnosis Date   Collagen vascular disease    Connective tissue disease    GERD (gastroesophageal reflux disease)    Hypertension    Lupus    Pulmonary hypertension    RA (rheumatoid arthritis)    Raynaud disease     Past Surgical History:  Procedure Laterality Date   BILATERAL CARPAL TUNNEL RELEASE     Broken femur rod      CATARACT EXTRACTION     bilateral   COLONOSCOPY N/A 08/29/2015   Procedure: COLONOSCOPY;  Surgeon: [name], MD;  Location: [facility];  Service: Endoscopy;  Laterality: N/A;  1030   ESOPHAGEAL DILATION N/A 02/11/2018   Procedure: ESOPHAGEAL DILATION;  Surgeon: [name], MD;  Location: [facility];  Service: Endoscopy;  Laterality: N/A;   ESOPHAGOGASTRODUODENOSCOPY N/A 10/20/2014   Procedure: ESOPHAGOGASTRODUODENOSCOPY (EGD);  Surgeon: [name], MD;  Location: [facility];  Service: Endoscopy;  Laterality: N/A;  210   ESOPHAGOGASTRODUODENOSCOPY N/A 02/11/2018   Procedure: ESOPHAGOGASTRODUODENOSCOPY (EGD);  Surgeon: [name], MD;  [facility]: [facility];  Service: Endoscopy;  Laterality: N/A;  10:30   POLYPECTOMY  02/11/2018   Procedure: POLYPECTOMY;  Surgeon: [name], MD;  Location: [facility];  Service: Endoscopy;;  prepyloric(HSx1)    Current Outpatient Medications  Medication Sig Dispense Refill   calcium-vitamin D 250-100 MG-UNIT tablet Take 1 tablet by mouth 3 (three) times daily.     carboxymethylcellulose (REFRESH PLUS) 0.5 % SOLN 1 drop daily as needed.     cyanocobalamin 1000 MCG tablet Take 1,000 mcg by mouth daily.     cyclobenzaprine (FLEXERIL) 10 MG tablet Take 10 mg by mouth.     diltiazem (CARDIZEM) 120 MG tablet Take 120 mg by mouth daily.     docusate sodium (COLACE) 100 MG capsule Take 100 mg by mouth daily as needed for mild constipation.     folic acid (FOLVITE) 1 MG tablet Take 2 mg by mouth daily.     furosemide (LASIX) 20 MG tablet Take 20 mg by mouth daily.     Lactobacillus (PROBIOTIC ACIDOPHILUS PO) Take 1 capsule by mouth daily with lunch.     levocetirizine (XYZAL) 5 MG tablet Take 5 mg by mouth every evening.     methotrexate (RHEUMATREX) 2.5 MG tablet Take 2.5 mg by mouth once a week. Caution:Chemotherapy. Protect from light. Takes 5 tablets bid once a week     Multiple Vitamin (MULTIVITAMIN WITH MINERALS) TABS  tablet Take 1 tablet by mouth daily with lunch.     naproxen sodium (ALEVE) 220 MG tablet Take 1-2 tablets (220-440 mg total) by mouth See admin instructions. Take 1 tablet (220 mg) by mouth with lunch & take 2 tablets (440 mg) at night.     Omega-3 Fatty Acids (FISH OIL PO) Take 1 capsule by mouth. Lunch & bedtime     omeprazole (PRILOSEC) 40 MG capsule Take 1 capsule (40 mg total) by mouth daily. 90 capsule 3   OVER THE COUNTER MEDICATION Mag ox 400 mg daily     oxybutynin (DITROPAN) 5 MG tablet Take 5 mg by mouth 2 (two) times daily.     polyethylene glycol (MIRALAX / GLYCOLAX) 17 g packet Take 17 g by mouth as needed.     pravastatin (PRAVACHOL) 40 MG tablet Take  40 mg by mouth daily with supper.     Probiotic Product (PROBIOTIC DAILY PO) Take by mouth daily.     sodium chloride (MURO 128) 2 % ophthalmic solution Place 1 drop into both eyes daily.     sodium chloride (MURO 128) 5 % ophthalmic ointment Place 1 application into both eyes at bedtime.     spironolactone (ALDACTONE) 25 MG tablet Take 25 mg by mouth daily.     Tadalafil, PAH, (ADCIRCA) 20 MG TABS Take 40 mg by mouth daily with lunch.      vitamin C (ASCORBIC ACID) 500 MG tablet Take 1,000 mg by mouth daily with lunch.      albuterol (VENTOLIN HFA) 108 (90 Base) MCG/ACT inhaler Inhale into the lungs every 6 (six) hours as needed for wheezing or shortness of breath. (Patient not taking: Reported on 05/22/2022)     cycloSPORINE, PF, 0.09 % SOLN Place 1 drop into both eyes 2 (two) times daily. (Patient not taking: Reported on 05/22/2022)     No current facility-administered medications for this visit.    Allergies as of 05/22/2022 - Review Complete 07/09/2021  Allergen Reaction Noted   Daypro [oxaprozin] Other (See Comments) 09/27/2014   Penicillins Hives, Rash, and Other (See Comments) 09/27/2014    No family history on file.  Social History   Socioeconomic History   Marital status: Legally Separated    Spouse name: Not on  file   Number of children: Not on file   Years of education: Not on file   Highest education level: Not on file  Occupational History   Not on file  Tobacco Use   Smoking status: Former    Passive exposure: Past   Smokeless tobacco: Former   Tobacco comments:    quit many years ago  Vaping Use   Vaping Use: Never used  Substance and Sexual Activity   Alcohol use: No    Alcohol/week: 0.0 standard drinks of alcohol   Drug use: No   Sexual activity: Not on file  Other Topics Concern   Not on file  Social History Narrative   Not on file   Social Determinants of Health   Financial Resource Strain: Not on file  Food Insecurity: Not on file  Transportation Needs: Not on file  Physical Activity: Not on file  Stress: Not on file  Social Connections: Not on file   Review of systems General: negative for malaise, night sweats, fever, chills, weight loss Neck: Negative for lumps, goiter, pain and significant neck swelling Resp: Negative for cough, wheezing, dyspnea at rest CV: Negative for chest pain, leg swelling, palpitations, orthopnea GI: denies melena, hematochezia, nausea, vomiting, diarrhea, dysphagia, odyonophagia, early satiety or unintentional weight loss. +change in stool caliber +GERD symptoms +constipation MSK: Negative for joint pain or swelling, back pain, and muscle pain. Derm: Negative for itching or rash Psych: Denies depression, anxiety, memory loss, confusion. No homicidal or suicidal ideation.  Heme: Negative for prolonged bleeding, bruising easily, and swollen nodes. Endocrine: Negative for cold or heat intolerance, polyuria, polydipsia and goiter. Neuro: negative for tremor, gait imbalance, syncope and seizures. The remainder of the review of systems is noncontributory.  Physical Exam: BP 125/65 (BP Location: Right Arm, Patient Position: Sitting, Cuff Size: Normal)   Pulse 69   Ht 5\' 2"  (1.575 m)   Wt 98 lb 14.4 oz (44.9 kg)   BMI 18.09 kg/m  General:    Alert and oriented. No distress noted. Pleasant and cooperative.  Head:  Normocephalic and atraumatic. Eyes:  Conjuctiva clear without scleral icterus. Mouth:  Oral mucosa pink and moist. Good dentition. No lesions. Heart: Normal rate and rhythm, s1 and s2 heart sounds present.  Lungs: Clear lung sounds in all lobes. Respirations equal and unlabored. Abdomen:  +BS, soft, non-tender and non-distended. No rebound or guarding. No HSM or masses noted. Derm: No palmar erythema or jaundice Msk:  Symmetrical without gross deformities. Normal posture. Extremities:  Without edema. Neurologic:  Alert and  oriented x4 Psych:  Alert and cooperative. Normal mood and affect.  Invalid input(s): "6 MONTHS"   ASSESSMENT: Jessica Escobar is a 72 y.o. female presenting today for GERD/dysphagia and change in stool caliber.  GERD/dysphagia:GERD not well controlled on omeprazole, requiring supplemental maalox almost nightly. She has been on omeprazole for a few years, uncontrolled GERD may be secondary to prolonged use of omeprazole, will try switching her PPI therapy to protonix 40mg  daily. In regards to her dysphagia, this has been ongoing for the past few years, occurring 2-3x/week, last EGD with dilation was in 2020, no abnormality noted but empiric dilation seemed to improve her symptoms. Barium esophagram in June 2023 without findings to explain her dysphagia. Would recommend proceeding with EGD +/- dilation for further evaluation of her symptoms. At this time, she wants to try and change her diet, avoid meats/thicker foods and make sure she is chewing well and taking small bites, she does not want to proceed with EGD, if the changes above do not improve her dysphagia, she will let me know  Change in stool caliber: over the past year notes skinnier stools, about the diameter of an ink pen, sometimes a bit larger but never the size of her previous/normal stools. Denies rectal bleeding or melena. No weight loss  but she has not been able to gain over 100 lbs. Would recommend proceeding with colonoscopy for further evaluation of change in stool caliber. As above, patient also wants to hold off on colonoscopy. I did discuss with her that given change in stool caliber, it is important to evaluate this as I ultimately cannot rule out malignancy. Patient still prefers to hold off, she will let me know if she wishes to proceed with endoscopic evaluations.    PLAN:  Stop omeprazole, start protonix 40mg  daily/reflux precautions  2.  Pt to make dietary changes/chewing precautions in regards to dysphagia  3.  She will make me aware if she wishes to proceed with EGD/Colonoscopy   All questions were answered, patient verbalized understanding and is in agreement with plan as outlined above.    Follow Up: 3 months   Lynk Marti L. Jeanmarie Hubert, MSN, APRN, AGNP-C Adult-Gerontology Nurse Practitioner [facility]  I have reviewed the note and agree with the APP's assessment as described in this progress note  Katrinka Blazing, MD Gastroenterology and Hepatology [facility]

## 2022-05-26 DIAGNOSIS — R195 Other fecal abnormalities: Secondary | ICD-10-CM | POA: Insufficient documentation

## 2022-07-15 ENCOUNTER — Telehealth (INDEPENDENT_AMBULATORY_CARE_PROVIDER_SITE_OTHER): Payer: Self-pay | Admitting: Gastroenterology

## 2022-07-15 NOTE — Telephone Encounter (Signed)
Pt sister left voicemail stating that pt was seen by Leeroy Bock was told to call to set up EGD and TCS. Pt was seen in April.   Per office note: 3.  She will make me aware if she wishes to proceed with EGD/Colonoscopy     Please advise. Thank you.

## 2022-07-15 NOTE — Telephone Encounter (Signed)
Left message for sister to return call Have opening on 07/18/22

## 2022-07-16 NOTE — Telephone Encounter (Signed)
Pt sister called into office and states they are unable to come in 07/18/22. Advised pt sister that I would place pt on cancellation list and call if any cancellations for July

## 2022-08-04 MED ORDER — CLENPIQ 10-3.5-12 MG-GM -GM/175ML PO SOLN: 1.0000 | ORAL | 0 refills | Status: DC

## 2022-08-04 NOTE — Telephone Encounter (Signed)
Could we schedule with Dr.Ahmed? Please advise. Thank you

## 2022-08-04 NOTE — Addendum Note (Signed)
Addended by: Marlowe Shores on: 08/04/2022 02:06 PM   Modules accepted: Orders

## 2022-08-04 NOTE — Telephone Encounter (Signed)
Pt sister Annice Pih contacted and pt is scheduled for 08/14/22 with Dr.Ahmed at 9:30. Requested Clenpiq sent to pharmacy. Instructions mailed to [address] address. Will call sister with pre op appt

## 2022-08-05 ENCOUNTER — Telehealth (INDEPENDENT_AMBULATORY_CARE_PROVIDER_SITE_OTHER): Payer: Self-pay | Admitting: *Deleted

## 2022-08-05 ENCOUNTER — Other Ambulatory Visit (INDEPENDENT_AMBULATORY_CARE_PROVIDER_SITE_OTHER): Payer: Self-pay | Admitting: *Deleted

## 2022-08-05 NOTE — Telephone Encounter (Signed)
Pt sister Annice Pih contacted with pre op appt information Pre op 08/12/22 @ 2:45pm

## 2022-08-06 NOTE — Telephone Encounter (Signed)
error 

## 2022-08-11 ENCOUNTER — Telehealth: Payer: Self-pay | Admitting: *Deleted

## 2022-08-11 NOTE — Patient Instructions (Signed)
Jessica Escobar  08/11/2022     @PREFPERIOPPHARMACY @   Your procedure is scheduled on  08/14/2022.   Report to [facility] at  1210  P.M.   Call this number if you have problems the morning of surgery:  [phone]  If you experience any cold or flu symptoms such as cough, fever, chills, shortness of breath, etc. between now and your scheduled surgery, please notify us at the above number.   Remember:  Follow the diet and prep instructions given to you by the office.     Take these medicines the morning of surgery with A SIP OF WATER        flexeril(if needed), diltiazem, pantoprazole, oxybutynin, adcirca.     Do not wear jewelry, make-up or nail polish, including gel polish,  artificial nails, or any other type of covering on natural nails (fingers and  toes).  Do not wear lotions, powders, or perfumes, or deodorant.  Do not shave 48 hours prior to surgery.  Men may shave face and neck.  Do not bring valuables to the hospital.  [facility] is not responsible for any belongings or valuables.  Contacts, dentures or bridgework may not be worn into surgery.  Leave your suitcase in the car.  After surgery it may be brought to your room.  For patients admitted to the hospital, discharge time will be determined by your treatment team.  Patients discharged the day of surgery will not be allowed to drive home and must have someone with them for 24 hours.    Special instructions:   DO NOT smoke tobacco or vape for 24 hours before your procedure.  Please read over the following fact sheets that you were given. Anesthesia Post-op Instructions and Care and Recovery After Surgery      Upper Endoscopy, Adult, Care After After the procedure, it is common to have a sore throat. It is also common to have: Mild stomach pain or discomfort. Bloating. Nausea. Follow these instructions at home: The instructions below may help you care for yourself at home. Your health  care provider may give you more instructions. If you have questions, ask your health care provider. If you were given a sedative during the procedure, it can affect you for several hours. Do not drive or operate machinery until your health care provider says that it is safe. If you will be going home right after the procedure, plan to have a responsible adult: Take you home from the hospital or clinic. You will not be allowed to drive. Care for you for the time you are told. Follow instructions from your health care provider about what you may eat and drink. Return to your normal activities as told by your health care provider. Ask your health care provider what activities are safe for you. Take over-the-counter and prescription medicines only as told by your health care provider. Contact a health care provider if you: Have a sore throat that lasts longer than one day. Have trouble swallowing. Have a fever. Get help right away if you: Vomit blood or your vomit looks like coffee grounds. Have bloody, black, or tarry stools. Have a very bad sore throat or you cannot swallow. Have difficulty breathing or very bad pain in your chest or abdomen. These symptoms may be an emergency. Get help right away. Call 911. Do not wait to see if the symptoms will go away. Do not drive yourself to the hospital.  Summary After the procedure, it is common to have a sore throat, mild stomach discomfort, bloating, and nausea. If you were given a sedative during the procedure, it can affect you for several hours. Do not drive until your health care provider says that it is safe. Follow instructions from your health care provider about what you may eat and drink. Return to your normal activities as told by your health care provider. This information is not intended to replace advice given to you by your health care provider. Make sure you discuss any questions you have with your health care provider. Document  Revised: 04/24/2021 Document Reviewed: 04/24/2021 Elsevier Patient Education  2024 [employer] Esophageal Dilatation Esophageal dilatation, also called esophageal dilation, is a procedure to widen or open a blocked or narrowed part of the esophagus. The esophagus is the part of the body that moves food and liquid from the mouth to the stomach. You may need this procedure if: You have a buildup of scar tissue in your esophagus that makes it difficult, painful, or impossible to swallow. This can be caused by gastroesophageal reflux disease (GERD). You have cancer of the esophagus. There is a problem with how food moves through your esophagus. In some cases, you may need this procedure repeated at a later time to dilate the esophagus gradually. Tell a health care provider about: Any allergies you have. All medicines you are taking, including vitamins, herbs, eye drops, creams, and over-the-counter medicines. Any problems you or family members have had with anesthetic medicines. Any blood disorders you have. Any surgeries you have had. Any medical conditions you have. Any antibiotic medicines you are required to take before dental procedures. Whether you are pregnant or may be pregnant. What are the risks? Generally, this is a safe procedure. However, problems may occur, including: Bleeding due to a tear in the lining of the esophagus. A hole, or perforation, in the esophagus. What happens before the procedure? Ask your health care provider about: Changing or stopping your regular medicines. This is especially important if you are taking diabetes medicines or blood thinners. Taking medicines such as aspirin and ibuprofen. These medicines can thin your blood. Do not take these medicines unless your health care provider tells you to take them. Taking over-the-counter medicines, vitamins, herbs, and supplements. Follow instructions from your health care provider about eating or drinking  restrictions. Plan to have a responsible adult take you home from the hospital or clinic. Plan to have a responsible adult care for you for the time you are told after you leave the hospital or clinic. This is important. What happens during the procedure? You may be given a medicine to help you relax (sedative). A numbing medicine may be sprayed into the back of your throat, or you may gargle the medicine. Your health care provider may perform the dilatation using various surgical instruments, such as: Simple dilators. This instrument is carefully placed in the esophagus to stretch it. Guided wire bougies. This involves using an endoscope to insert a wire into the esophagus. A dilator is passed over this wire to enlarge the esophagus. Then the wire is removed. Balloon dilators. An endoscope with a small balloon is inserted into the esophagus. The balloon is inflated to stretch the esophagus and open it up. The procedure may vary among health care providers and hospitals. What can I expect after the procedure? Your blood pressure, heart rate, breathing rate, and blood oxygen level will be monitored until you leave the hospital or  clinic. Your throat may feel slightly sore and numb. This will get better over time. You will not be allowed to eat or drink until your throat is no longer numb. When you are able to drink, urinate, and sit on the edge of the bed without nausea or dizziness, you may be able to return home. Follow these instructions at home: Take over-the-counter and prescription medicines only as told by your health care provider. If you were given a sedative during the procedure, it can affect you for several hours. Do not drive or operate machinery until your health care provider says that it is safe. Plan to have a responsible adult care for you for the time you are told. This is important. Follow instructions from your health care provider about any eating or drinking  restrictions. Do not use any products that contain nicotine or tobacco, such as cigarettes, e-cigarettes, and chewing tobacco. If you need help quitting, ask your health care provider. Keep all follow-up visits. This is important. Contact a health care provider if: You have a fever. You have pain that is not relieved by medicine. Get help right away if: You have chest pain. You have trouble breathing. You have trouble swallowing. You vomit blood. You have black, tarry, or bloody stools. These symptoms may represent a serious problem that is an emergency. Do not wait to see if the symptoms will go away. Get medical help right away. Call your local emergency services (911 in the [place] Do not drive yourself to the hospital. Summary Esophageal dilatation, also called esophageal dilation, is a procedure to widen or open a blocked or narrowed part of the esophagus. Plan to have a responsible adult take you home from the hospital or clinic. For this procedure, a numbing medicine may be sprayed into the back of your throat, or you may gargle the medicine. Do not drive or operate machinery until your health care provider says that it is safe. This information is not intended to replace advice given to you by your health care provider. Make sure you discuss any questions you have with your health care provider. Document Revised: 06/01/2019 Document Reviewed: 06/01/2019 Elsevier Patient Education  2024 Elsevier Inc. Colonoscopy, Adult, Care After The following information offers guidance on how to care for yourself after your procedure. Your health care provider may also give you more specific instructions. If you have problems or questions, contact your health care provider. What can I expect after the procedure? After the procedure, it is common to have: A small amount of blood in your stool for 24 hours after the procedure. Some gas. Mild cramping or bloating of your abdomen. Follow these  instructions at home: Eating and drinking  Drink enough fluid to keep your urine pale yellow. Follow instructions from your health care provider about eating or drinking restrictions. Resume your normal diet as told by your health care provider. Avoid heavy or fried foods that are hard to digest. Activity Rest as told by your health care provider. Avoid sitting for a long time without moving. Get up to take short walks every 1-2 hours. This is important to improve blood flow and breathing. Ask for help if you feel weak or unsteady. Return to your normal activities as told by your health care provider. Ask your health care provider what activities are safe for you. Managing cramping and bloating  Try walking around when you have cramps or feel bloated. If directed, apply heat to your abdomen as told by your  health care provider. Use the heat source that your health care provider recommends, such as a moist heat pack or a heating pad. Place a towel between your skin and the heat source. Leave the heat on for 20-30 minutes. Remove the heat if your skin turns bright red. This is especially important if you are unable to feel pain, heat, or cold. You have a greater risk of getting burned. General instructions If you were given a sedative during the procedure, it can affect you for several hours. Do not drive or operate machinery until your health care provider says that it is safe. For the first 24 hours after the procedure: Do not sign important documents. Do not drink alcohol. Do your regular daily activities at a slower pace than normal. Eat soft foods that are easy to digest. Take over-the-counter and prescription medicines only as told by your health care provider. Keep all follow-up visits. This is important. Contact a health care provider if: You have blood in your stool 2-3 days after the procedure. Get help right away if: You have more than a small spotting of blood in your  stool. You have large blood clots in your stool. You have swelling of your abdomen. You have nausea or vomiting. You have a fever. You have increasing pain in your abdomen that is not relieved with medicine. These symptoms may be an emergency. Get help right away. Call 911. Do not wait to see if the symptoms will go away. Do not drive yourself to the hospital. Summary After the procedure, it is common to have a small amount of blood in your stool. You may also have mild cramping and bloating of your abdomen. If you were given a sedative during the procedure, it can affect you for several hours. Do not drive or operate machinery until your health care provider says that it is safe. Get help right away if you have a lot of blood in your stool, nausea or vomiting, a fever, or increased pain in your abdomen. This information is not intended to replace advice given to you by your health care provider. Make sure you discuss any questions you have with your health care provider. Document Revised: 02/25/2022 Document Reviewed: 09/05/2020 Elsevier Patient Education  2024 Elsevier Inc. Monitored Anesthesia Care, Care After The following information offers guidance on how to care for yourself after your procedure. Your health care provider may also give you more specific instructions. If you have problems or questions, contact your health care provider. What can I expect after the procedure? After the procedure, it is common to have: Tiredness. Little or no memory about what happened during or after the procedure. Impaired judgment when it comes to making decisions. Nausea or vomiting. Some trouble with balance. Follow these instructions at home: For the time period you were told by your health care provider:  Rest. Do not participate in activities where you could fall or become injured. Do not drive or use machinery. Do not drink alcohol. Do not take sleeping pills or medicines that cause  drowsiness. Do not make important decisions or sign legal documents. Do not take care of children on your own. Medicines Take over-the-counter and prescription medicines only as told by your health care provider. If you were prescribed antibiotics, take them as told by your health care provider. Do not stop using the antibiotic even if you start to feel better. Eating and drinking Follow instructions from your health care provider about what you may eat and  drink. Drink enough fluid to keep your urine pale yellow. If you vomit: Drink clear fluids slowly and in small amounts as you are able. Clear fluids include water, ice chips, low-calorie sports drinks, and fruit juice that has water added to it (diluted fruit juice). Eat light and bland foods in small amounts as you are able. These foods include bananas, applesauce, rice, lean meats, toast, and crackers. General instructions  Have a responsible adult stay with you for the time you are told. It is important to have someone help care for you until you are awake and alert. If you have sleep apnea, surgery and some medicines can increase your risk for breathing problems. Follow instructions from your health care provider about wearing your sleep device: When you are sleeping. This includes during daytime naps. While taking prescription pain medicines, sleeping medicines, or medicines that make you drowsy. Do not use any products that contain nicotine or tobacco. These products include cigarettes, chewing tobacco, and vaping devices, such as e-cigarettes. If you need help quitting, ask your health care provider. Contact a health care provider if: You feel nauseous or vomit every time you eat or drink. You feel light-headed. You are still sleepy or having trouble with balance after 24 hours. You get a rash. You have a fever. You have redness or swelling around the IV site. Get help right away if: You have trouble breathing. You have new  confusion after you get home. These symptoms may be an emergency. Get help right away. Call 911. Do not wait to see if the symptoms will go away. Do not drive yourself to the hospital. This information is not intended to replace advice given to you by your health care provider. Make sure you discuss any questions you have with your health care provider. Document Revised: 06/10/2021 Document Reviewed: 06/10/2021 Elsevier Patient Education  2024 [employer].

## 2022-08-11 NOTE — Telephone Encounter (Signed)
Spoke with pt and she stated Thursday evening time worked fine for procedure. I have sent endo message making aware and FYI to [facility].

## 2022-08-12 ENCOUNTER — Encounter (HOSPITAL_COMMUNITY): Payer: Self-pay

## 2022-08-12 ENCOUNTER — Encounter (HOSPITAL_COMMUNITY)
Admission: RE | Admit: 2022-08-12 | Discharge: 2022-08-12 | Disposition: A | Discharge status: Home / Self Care | Payer: Medicare Other | Source: Ambulatory Visit | Attending: Gastroenterology | Admitting: Gastroenterology

## 2022-08-12 VITALS — BP 114/50 | HR 68 | Temp 97.8°F | Resp 18 | Ht 62.0 in | Wt 99.0 lb

## 2022-08-12 DIAGNOSIS — D649 Anemia, unspecified: Secondary | ICD-10-CM | POA: Insufficient documentation

## 2022-08-12 DIAGNOSIS — I272 Pulmonary hypertension, unspecified: Secondary | ICD-10-CM | POA: Insufficient documentation

## 2022-08-12 DIAGNOSIS — Z79899 Other long term (current) drug therapy: Secondary | ICD-10-CM | POA: Diagnosis not present

## 2022-08-12 DIAGNOSIS — Z01818 Encounter for other preprocedural examination: Secondary | ICD-10-CM | POA: Diagnosis not present

## 2022-08-12 LAB — BASIC METABOLIC PANEL
Anion gap: 5 (ref 5–15)
BUN: 35 mg/dL — ABNORMAL HIGH (ref 8–23)
CO2: 31 mmol/L (ref 22–32)
Calcium: 9.7 mg/dL (ref 8.9–10.3)
Chloride: 102 mmol/L (ref 98–111)
Creatinine, Ser: 0.99 mg/dL (ref 0.44–1.00)
GFR, Estimated: >60 mL/min (ref >60)
Glucose, Bld: 88 mg/dL (ref 70–99)
Potassium: 4.5 mmol/L (ref 3.5–5.1)
Sodium: 138 mmol/L (ref 135–145)

## 2022-08-12 LAB — CBC WITH DIFFERENTIAL/PLATELET
Abs Immature Granulocytes: 0.02 10*3/uL (ref 0.00–0.07)
Basophils Absolute: 0 10*3/uL (ref 0.0–0.1)
Basophils Relative: 0 %
Eosinophils Absolute: 0.1 10*3/uL (ref 0.0–0.5)
Eosinophils Relative: 2 %
HCT: 32.1 % — ABNORMAL LOW (ref 36.0–46.0)
Hemoglobin: 10.1 g/dL — ABNORMAL LOW (ref 12.0–15.0)
Immature Granulocytes: 1 %
Lymphocytes Relative: 9 %
Lymphs Abs: 0.4 10*3/uL — ABNORMAL LOW (ref 0.7–4.0)
MCH: 34.5 pg — ABNORMAL HIGH (ref 26.0–34.0)
MCHC: 31.5 g/dL (ref 30.0–36.0)
MCV: 109.6 fL — ABNORMAL HIGH (ref 80.0–100.0)
Monocytes Absolute: 0.6 10*3/uL (ref 0.1–1.0)
Monocytes Relative: 15 %
Neutro Abs: 3 10*3/uL (ref 1.7–7.7)
Neutrophils Relative %: 73 %
Platelets: 103 10*3/uL — ABNORMAL LOW (ref 150–400)
RBC: 2.93 MIL/uL — ABNORMAL LOW (ref 3.87–5.11)
RDW: 15.7 % — ABNORMAL HIGH (ref 11.5–15.5)
WBC: 4.1 10*3/uL (ref 4.0–10.5)
nRBC: 0 % (ref 0.0–0.2)

## 2022-08-13 ENCOUNTER — Telehealth: Payer: Self-pay | Admitting: *Deleted

## 2022-08-13 NOTE — H&P (View-Only) (Signed)
Patient has a history of Pulmonary HTN managed by [facility] in [place].  She is scheduled for a Colonoscopy and EGD/ED tomorrow. Dr Joanne Gavel from [facility] recommends that the patient only have the EGD/ED on 08/14/22 due to her condition and general anesthesia (Monitored Anesthesia care)risks. Colonoscopy can be scheduled at a later time if patient tolerates general anesthesia  (Monitored anesthesia care) well.

## 2022-08-13 NOTE — Progress Notes (Addendum)
Patient has a history of Pulmonary HTN managed by [facility] in [place].  She is scheduled for a Colonoscopy and EGD/ED tomorrow. Dr Joanne Gavel from [facility] recommends that the patient only have the EGD/ED on 08/14/22 due to her condition and general anesthesia (Monitored Anesthesia care)risks. Colonoscopy can be scheduled at a later time if patient tolerates general anesthesia  (Monitored anesthesia care) well.

## 2022-08-13 NOTE — Telephone Encounter (Signed)
change in procedure Received: Today Jethro Bolus, RN  Elinor Dodge, LPN; Carloyn Jaeger H Patient will only be scheduled for an EGD/ED tomorrow. No Colonoscopy. Due to her Pulmonary HTN the cardiologist advises to do one procedure at a time under general anesthesia. Dr Tasia Catchings is aware.

## 2022-08-14 ENCOUNTER — Ambulatory Visit (HOSPITAL_COMMUNITY): Payer: Medicare Other | Admitting: Anesthesiology

## 2022-08-14 ENCOUNTER — Encounter (HOSPITAL_COMMUNITY)
Admission: RE | Disposition: A | Discharge status: Home / Self Care | Payer: Self-pay | Source: Ambulatory Visit | Attending: Gastroenterology

## 2022-08-14 ENCOUNTER — Ambulatory Visit (HOSPITAL_COMMUNITY)
Admission: RE | Admit: 2022-08-14 | Discharge: 2022-08-14 | Disposition: A | Discharge status: Home / Self Care | Payer: Medicare Other | Source: Ambulatory Visit | Attending: Gastroenterology | Admitting: Gastroenterology

## 2022-08-14 ENCOUNTER — Encounter (HOSPITAL_COMMUNITY): Payer: Self-pay

## 2022-08-14 ENCOUNTER — Ambulatory Visit (HOSPITAL_BASED_OUTPATIENT_CLINIC_OR_DEPARTMENT_OTHER): Payer: Medicare Other | Admitting: Anesthesiology

## 2022-08-14 DIAGNOSIS — K259 Gastric ulcer, unspecified as acute or chronic, without hemorrhage or perforation: Secondary | ICD-10-CM | POA: Diagnosis not present

## 2022-08-14 DIAGNOSIS — M069 Rheumatoid arthritis, unspecified: Secondary | ICD-10-CM | POA: Diagnosis not present

## 2022-08-14 DIAGNOSIS — K2210 Ulcer of esophagus without bleeding: Secondary | ICD-10-CM | POA: Insufficient documentation

## 2022-08-14 DIAGNOSIS — I272 Pulmonary hypertension, unspecified: Secondary | ICD-10-CM | POA: Diagnosis not present

## 2022-08-14 DIAGNOSIS — Z09 Encounter for follow-up examination after completed treatment for conditions other than malignant neoplasm: Secondary | ICD-10-CM | POA: Insufficient documentation

## 2022-08-14 DIAGNOSIS — R1319 Other dysphagia: Secondary | ICD-10-CM

## 2022-08-14 DIAGNOSIS — K31A19 Gastric intestinal metaplasia without dysplasia, unspecified site: Secondary | ICD-10-CM | POA: Insufficient documentation

## 2022-08-14 DIAGNOSIS — I10 Essential (primary) hypertension: Secondary | ICD-10-CM | POA: Insufficient documentation

## 2022-08-14 DIAGNOSIS — Z87891 Personal history of nicotine dependence: Secondary | ICD-10-CM

## 2022-08-14 DIAGNOSIS — M329 Systemic lupus erythematosus, unspecified: Secondary | ICD-10-CM | POA: Diagnosis not present

## 2022-08-14 DIAGNOSIS — Z8781 Personal history of (healed) traumatic fracture: Secondary | ICD-10-CM | POA: Insufficient documentation

## 2022-08-14 DIAGNOSIS — K219 Gastro-esophageal reflux disease without esophagitis: Secondary | ICD-10-CM | POA: Insufficient documentation

## 2022-08-14 DIAGNOSIS — R1310 Dysphagia, unspecified: Secondary | ICD-10-CM | POA: Diagnosis present

## 2022-08-14 HISTORY — PX: ESOPHAGOGASTRODUODENOSCOPY (EGD) WITH PROPOFOL: SHX5813

## 2022-08-14 HISTORY — PX: BIOPSY: SHX5522

## 2022-08-14 LAB — HM COLONOSCOPY

## 2022-08-14 SURGERY — ESOPHAGOGASTRODUODENOSCOPY (EGD) WITH PROPOFOL
Anesthesia: General

## 2022-08-14 MED ORDER — PROPOFOL 500 MG/50ML IV EMUL
INTRAVENOUS | Status: DC | PRN
  Administered 2022-08-14: 150 ug/kg/min via INTRAVENOUS

## 2022-08-14 MED ORDER — LACTATED RINGERS IV SOLN: INTRAVENOUS | Status: DC

## 2022-08-14 MED ORDER — PHENYLEPHRINE 80 MCG/ML (10ML) SYRINGE FOR IV PUSH (FOR BLOOD PRESSURE SUPPORT)
PREFILLED_SYRINGE | INTRAVENOUS | Status: AC
  Filled 2022-08-14: qty 10

## 2022-08-14 MED ORDER — PROPOFOL 10 MG/ML IV BOLUS
INTRAVENOUS | Status: DC | PRN
Start: 2022-08-14 — End: 2022-08-14
  Administered 2022-08-14: 30 mg via INTRAVENOUS
  Administered 2022-08-14: 100 mg via INTRAVENOUS
  Administered 2022-08-14: 40 mg via INTRAVENOUS
  Administered 2022-08-14: 50 mg via INTRAVENOUS

## 2022-08-14 MED ORDER — PANTOPRAZOLE SODIUM 40 MG PO TBEC: 40.0000 mg | DELAYED_RELEASE_TABLET | Freq: Two times a day (BID) | ORAL | 2 refills | Status: DC

## 2022-08-14 MED ORDER — LIDOCAINE HCL (CARDIAC) PF 100 MG/5ML IV SOSY
PREFILLED_SYRINGE | INTRAVENOUS | Status: DC | PRN
  Administered 2022-08-14: 50 mg via INTRAVENOUS

## 2022-08-14 MED ORDER — PHENYLEPHRINE 80 MCG/ML (10ML) SYRINGE FOR IV PUSH (FOR BLOOD PRESSURE SUPPORT)
PREFILLED_SYRINGE | INTRAVENOUS | Status: DC | PRN
  Administered 2022-08-14 (×2): 160 ug via INTRAVENOUS

## 2022-08-14 NOTE — Anesthesia Postprocedure Evaluation (Signed)
Anesthesia Post Note  Patient: Jessica Escobar  Procedure(s) Performed: ESOPHAGOGASTRODUODENOSCOPY (EGD) WITH PROPOFOL BIOPSY  Patient location during evaluation: Phase II Anesthesia Type: General Level of consciousness: awake and alert and oriented Pain management: pain level controlled Vital Signs Assessment: post-procedure vital signs reviewed and stable Respiratory status: spontaneous breathing, nonlabored ventilation and respiratory function stable Cardiovascular status: blood pressure returned to baseline and stable Postop Assessment: no apparent nausea or vomiting Anesthetic complications: no  No notable events documented.   Last Vitals:  Vitals:   08/14/22 1424 08/14/22 1430  BP: (!) 95/47 (!) 112/53  Pulse: 70   Resp: 15   Temp: 36.9 C   SpO2: 100%     Last Pain:  Vitals:   08/14/22 1424  TempSrc: Oral  PainSc: Asleep                 Anabelle Bungert C Jveon Pound

## 2022-08-14 NOTE — Op Note (Signed)
[facility] Patient Name: Jessica Escobar Procedure Date: 08/14/2022 1:47 PM MRN: [id number] Date of Birth: [birth year] Attending MD: Sanjuan Dame , MD, [id number] CSN: [id number] Age: 72 Admit Type: Outpatient Procedure:                Upper GI endoscopy Indications:              Dysphagia Providers:                Sanjuan Dame, MD, Edrick Kins, RN, Dyann Ruddle Referring MD:              Medicines:                Monitored Anesthesia Care, Propofol per Anesthesia Complications:            No immediate complications. Estimated blood loss:                            Minimal. Estimated Blood Loss:     Estimated blood loss was minimal. Procedure:                Pre-Anesthesia Assessment:                           - Prior to the procedure, a History and Physical                            was performed, and patient medications and                            allergies were reviewed. The patient's tolerance of                            previous anesthesia was also reviewed. The risks                            and benefits of the procedure and the sedation                            options and risks were discussed with the patient.                            All questions were answered, and informed consent                            was obtained. Prior Anticoagulants: The patient has                            taken no anticoagulant or antiplatelet agents. ASA                            Grade Assessment: III - A patient with severe                            systemic disease. After reviewing the risks and  benefits, the patient was deemed in satisfactory                            condition to undergo the procedure.                           After obtaining informed consent, the endoscope was                            passed under direct vision. Throughout the                            procedure, the patient's blood pressure, pulse, and                             oxygen saturations were monitored continuously. The                            [id number]-[id number] ([id number]) scope was introduced through the                            mouth, and advanced to the second part of duodenum.                            The upper GI endoscopy was accomplished without                            difficulty. The patient tolerated the procedure                            well. Scope In: 2:09:12 PM Scope Out: 2:21:59 PM Total Procedure Duration: 0 hours 12 minutes 47 seconds  Findings:      Many linear esophageal ulcers with no stigmata of recent bleeding were       found in the lower third of the esophagus. Biopsies were taken with a       cold forceps for histology.      LA Grade D (one or more mucosal breaks involving at least 75% of       esophageal circumference) esophagitis with no bleeding was found in the       lower third of the esophagus. Biopsies were taken with a cold forceps       for histology. Estimated blood loss was minimal.      One non-bleeding superficial gastric ulcer with no stigmata of bleeding       was found in the gastric antrum. The lesion was 3 mm in largest       dimension. Biopsies were taken with a cold forceps for Helicobacter       pylori testing. Estimated blood loss was minimal.      The duodenal bulb and second portion of the duodenum were normal. Impression:               - Esophageal ulcers with no stigmata of recent                            bleeding. Biopsied.                           -  LA Grade D erosive esophagitis with no bleeding.                            Biopsied.                           - Non-bleeding gastric ulcer with no stigmata of                            bleeding. Biopsied.                           - Normal duodenal bulb and second portion of the                            duodenum. Moderate Sedation:      Per Anesthesia Care Recommendation:           - Patient has a contact number available for                             emergencies. The signs and symptoms of potential                            delayed complications were discussed with the                            patient. Return to normal activities tomorrow.                            Written discharge instructions were provided to the                            patient.                           - Resume previous diet.                           - Post procedure medication orders were given.                           - Await pathology results.                           - Repeat upper endoscopy in 8 weeks to check                            healing.                           - Return to GI clinic in 6 weeks.                           start protonix 40mg  , 30 min twice daily Procedure Code(s):        --- Professional ---  40981, Esophagogastroduodenoscopy, flexible,                            transoral; with biopsy, single or multiple Diagnosis Code(s):        --- Professional ---                           K22.10, Ulcer of esophagus without bleeding                           K20.80, Other esophagitis without bleeding                           K25.9, Gastric ulcer, unspecified as acute or                            chronic, without hemorrhage or perforation                           R13.10, Dysphagia, unspecified CPT copyright 2022 American Medical Association. All rights reserved. The codes documented in this report are preliminary and upon coder review may  be revised to meet current compliance requirements. Sanjuan Dame, MD Sanjuan Dame, MD 08/14/2022 2:32:05 PM This report has been signed electronically. Number of Addenda: 0

## 2022-08-14 NOTE — Discharge Instructions (Addendum)
-   Patient has a contact number available for emergencies.  The signs and symptoms of potential delayed complications were discussed with the patient.  Return to normal activities tomorrow.  Written discharge instructions were provided to the patient.  - Resume previous diet.  - Post procedure medication orders were given.  - Await pathology results.  - Repeat upper endoscopy in 8 weeks to check healing.  - Return to GI clinic in 6 weeks.  start protonix 40mg  , 30 min twice daily

## 2022-08-14 NOTE — Transfer of Care (Signed)
Immediate Anesthesia Transfer of Care Note  Patient: Jessica Escobar  Procedure(s) Performed: ESOPHAGOGASTRODUODENOSCOPY (EGD) WITH PROPOFOL BIOPSY  Patient Location: Short Stay  Anesthesia Type:General  Level of Consciousness: drowsy  Airway & Oxygen Therapy: Patient Spontanous Breathing  Post-op Assessment: Report given to RN and Post -op Vital signs reviewed and stable  Post vital signs: Reviewed and stable  Last Vitals:  Vitals Value Taken Time  BP 95/47 08/14/22 1424  Temp    Pulse 70 08/14/22 1424  Resp 15 08/14/22 1424  SpO2 100 % 08/14/22 1424    Last Pain:  Vitals:   08/14/22 1403  TempSrc:   PainSc: 0-No pain         Complications: No notable events documented.

## 2022-08-14 NOTE — Anesthesia Preprocedure Evaluation (Signed)
Anesthesia Evaluation  Patient identified by MRN, date of birth, ID band Patient awake    Reviewed: Allergy & Precautions, H&P , NPO status , Patient's Chart, lab work & pertinent test results  Airway Mallampati: II  TM Distance: >3 FB Neck ROM: Full    Dental  (+) Dental Advisory Given, Upper Dentures   Pulmonary shortness of breath, with exertion and Long-Term Oxygen Therapy, neg COPD,  oxygen dependent, former smoker Severe pulmonary HTN   Pulmonary exam normal breath sounds clear to auscultation       Cardiovascular Exercise Tolerance: Poor hypertension, Pt. on medications + DOE  Normal cardiovascular exam Rhythm:Regular Rate:Normal  INTERPRETATION ---------------------------------------------------------------    NORMAL LEFT VENTRICULAR SYSTOLIC FUNCTION    NORMAL LA PRESSURES WITH NORMAL DIASTOLIC FUNCTION    NORMAL RIGHT VENTRICULAR SYSTOLIC FUNCTION    VALVULAR REGURGITATION: TRIVIAL MR, MILD PR, MODERATE TR    NO VALVULAR STENOSIS    3D acquisition and reconstructions were performed as part of this    examination to more accurately quantify the effects of identified    structural abnormalities as part of the exam. (post-processing on an    Independent workstation).     Compared with prior Echo study on 04/07/2019: LOW VELOCITY, MODERATE TR    NO DOPPLER EVIDENCE OF ELEVATED RVSP      Neuro/Psych negative neurological ROS  negative psych ROS   GI/Hepatic Neg liver ROS,GERD  Medicated and Poorly Controlled,,  Endo/Other  negative endocrine ROS    Renal/GU negative Renal ROS  negative genitourinary   Musculoskeletal  (+) Arthritis , Osteoarthritis and Rheumatoid disorders,    Abdominal   Peds negative pediatric ROS (+)  Hematology  (+) Blood dyscrasia, anemia   Anesthesia Other Findings Lupus  Raynaud's disease  Reproductive/Obstetrics negative OB ROS                              Anesthesia Physical Anesthesia Plan  ASA: 4  Anesthesia Plan: General   Post-op Pain Management: Minimal or no pain anticipated   Induction: Intravenous  PONV Risk Score and Plan: 1 and Propofol infusion  Airway Management Planned: Nasal Cannula and Natural Airway  Additional Equipment:   Intra-op Plan:   Post-operative Plan:   Informed Consent: I have reviewed the patients History and Physical, chart, labs and discussed the procedure including the risks, benefits and alternatives for the proposed anesthesia with the patient or authorized representative who has indicated his/her understanding and acceptance.     Dental advisory given  Plan Discussed with: CRNA and Surgeon  Anesthesia Plan Comments: (Discussed with pulmonologist regarding her severe pulmonary HTN, pulmonary HTN is stable as per recent cath.)        Anesthesia Quick Evaluation

## 2022-08-14 NOTE — Anesthesia Procedure Notes (Signed)
Date/Time: 08/14/2022 2:12 PM  Performed by: Julian Reil, CRNAPre-anesthesia Checklist: Patient identified, Emergency Drugs available, Suction available and Patient being monitored Patient Re-evaluated:Patient Re-evaluated prior to induction Oxygen Delivery Method: Nasal cannula Induction Type: IV induction Placement Confirmation: positive ETCO2

## 2022-08-14 NOTE — Interval H&P Note (Signed)
History and Physical Interval Note:  08/14/2022 1:54 PM  Jessica Escobar  has presented today for surgery, with the diagnosis of GERD,DYSPHAGIA, CHANGE IN STOOL CALIBER.  The various methods of treatment have been discussed with the patient and family. After consideration of risks, benefits and other options for treatment, the patient has consented to  Procedure(s) with comments: ESOPHAGOGASTRODUODENOSCOPY (EGD) WITH PROPOFOL (N/A) - 9:30AM;ASA 3, changed to EGD only per PAT BALLOON DILATION (N/A) - 9:30AM;ASA 3 as a surgical intervention.  The patient's history has been reviewed, patient examined, no change in status, stable for surgery.  I have reviewed the patient's chart and labs.  Questions were answered to the patient's satisfaction.     Juanetta Beets [name]

## 2022-08-18 ENCOUNTER — Encounter (INDEPENDENT_AMBULATORY_CARE_PROVIDER_SITE_OTHER): Payer: Self-pay | Admitting: *Deleted

## 2022-08-19 LAB — SURGICAL PATHOLOGY

## 2022-08-20 ENCOUNTER — Encounter (HOSPITAL_COMMUNITY): Payer: Self-pay | Admitting: Gastroenterology

## 2022-09-11 ENCOUNTER — Ambulatory Visit (INDEPENDENT_AMBULATORY_CARE_PROVIDER_SITE_OTHER): Payer: Medicare Other | Admitting: Gastroenterology

## 2022-09-11 ENCOUNTER — Encounter (INDEPENDENT_AMBULATORY_CARE_PROVIDER_SITE_OTHER): Payer: Self-pay | Admitting: Gastroenterology

## 2022-09-11 VITALS — BP 124/68 | HR 67 | Temp 97.8°F | Ht 62.5 in | Wt 98.1 lb

## 2022-09-11 DIAGNOSIS — K259 Gastric ulcer, unspecified as acute or chronic, without hemorrhage or perforation: Secondary | ICD-10-CM

## 2022-09-11 DIAGNOSIS — R1319 Other dysphagia: Secondary | ICD-10-CM

## 2022-09-11 DIAGNOSIS — R1310 Dysphagia, unspecified: Secondary | ICD-10-CM

## 2022-09-11 DIAGNOSIS — R197 Diarrhea, unspecified: Secondary | ICD-10-CM

## 2022-09-11 DIAGNOSIS — K219 Gastro-esophageal reflux disease without esophagitis: Secondary | ICD-10-CM | POA: Diagnosis not present

## 2022-09-11 DIAGNOSIS — K2210 Ulcer of esophagus without bleeding: Secondary | ICD-10-CM | POA: Diagnosis not present

## 2022-09-11 DIAGNOSIS — K5900 Constipation, unspecified: Secondary | ICD-10-CM

## 2022-09-11 NOTE — Patient Instructions (Signed)
Continue with protonix 40mg  twice daily Avoid greasy, spicy, fried, citrus foods, and be mindful that caffeine, carbonated drinks, chocolate and alcohol can increase reflux symptoms Stay upright 2-3 hours after eating, prior to lying down and avoid eating late in the evenings.  Can use a dose of maalox as needed prior to bedtime We will schedule repeat upper endoscopy to evaluate for healing of ulcers  Continue to chew thoroughly, take small bites, sips of liquids after bites and avoid thicker, drier foods  We will plan for potential colonoscopy closer to the end of the year  Follow up 3 months  It was a pleasure to see you today. I want to create trusting relationships with patients and provide genuine, compassionate, and quality care. I truly value your feedback! please be on the lookout for a survey regarding your visit with me today. I appreciate your input about our visit and your time in completing this!    Tamika Shropshire L. Jeanmarie Hubert, MSN, APRN, AGNP-C Adult-Gerontology Nurse Practitioner [facility] at [facility]

## 2022-09-11 NOTE — Progress Notes (Signed)
Referring Provider: Renaldo Harrison, DO Primary Care Physician:  Renaldo Harrison, DO Primary GI Physician: Dr. Tasia Catchings   Chief Complaint  Patient presents with   Gastroesophageal Reflux    Pt arrives for 3 month follow up. Still having issues with heartburn; worse at night. Taking 2 protonix daily. Hoping to get EGD +/- ED in the near future. Pt is wanting to know if she can take milk of magnesium after taking 2 protonix.   HPI:   Jessica Escobar is a 72 y.o. female with past medical history of  collagen vascular/tissue disorder, GERD, HTN, Lupus, Pulmonary HTN, RA Raunauds disease.   Patient presenting today for follow up of GERD, dysphagia, gastric and esophageal ulcers  Last seen April 2024, at that time, taking omeprazole 40mg  daily, requiring maalox nightly due to regurgitation. Having some dysphagia, 2-3x/week. No rectal bleeding or melena. Iron studies in June 2023 WNL, hgb at that time was 10, MCV 103.5, maintained on b12 and folic acid supplementation. Also with reported change in stool caliber.   Recommended to start protonix 40mg  daily, EGD/Colonoscopy ( pt declined at time of visit).  She did undergo EGD in July, protonix increased to BID dosing, findings as outlined below, will need repeat EGD again in September.  Last labs in July with hgb 10.1 (around baseline for patient), MCV 109.6, iron studies in mid 2023 were WNL  Present: Patient states that she does well after her morning dose of protonix, she is taking second dose around 1900, she eats dinner after that. She notes worsening reflux at night, usually within about an hour of eating. She usually does not go to bed until around 2am. Denies any reflux awaking her from sleep. She does not note any sore throat, hoarseness upon waking. She wants to resume her maalox. Dysphagia has improved some, she is taking smaller bites and trying to avoid certain foods such as breads.   No nausea or vomiting. She has a mix of constipation and  diarrhea. She has miralax as needed but she does not take this often.  She usually has a BM about 2-3 times per day, she previously had thinner, more narrow stools, currently she is having more normal sized stools, only occasionally having thinner diameter stools. Denies rectal bleeding or melena.  She was previously scheduled for Colonoscopy at time of her EGD, though pulmonary doctor wanted her to have the EGD and colonoscopy separately as they did not feel it was safe for her to be asleep for a longer period of time.   She notes she does not eat the way she should, thinks she needs to eat more fruits and veggies.   Barium esophagram: 07/19/21 Age-related esophageal dysmotility. Small sliding hiatal hernia with prominent GE reflux seen during exam to the level of the carina. No evidence of esophageal mass or stricture. Last Colonoscopy:2017 - The entire examined colon is normal. - extrinsic from nodular area anteriorly felt to be cervix. found on digital rectal exam. - No specimens collected. Last Endoscopy: 08/14/22  - Esophageal ulcers with no stigmata of recent                            bleeding. Biopsied.                           - LA Grade D erosive esophagitis with no bleeding.  Biopsied.                           - Non-bleeding gastric ulcer with no stigmata of                            bleeding. Biopsied.                           - Normal duodenal bulb and second portion of the                            duodenum. A. RANDOM GASTRIC BIOPSY:  Antral and oxyntic mucosa with mild chronic inflammation and intestinal  metaplasia.  Negative for Helicobacter pylori.  Negative for dysplasia.   B. ESOPHAGEAL ULCER BIOPSY:  Squamous mucosa, granulation tissue and exudate consistent with ulcer.  See comment.  Negative for dysplasia or malignancy.   C. DISTAL ESOPHAGEAL BIOPSY:  Squamous mucosa with mild reflux changes.  Negative for intestinal metaplasia or  dysplasia.   D. PROXIMAL ESOPHAGEAL BIOPSY:  Benign squamous mucosa.  Negative for eosinophilic esophagitis.   COMMENT:  B. Immunohistochemistry for cytomegalovirus (CMV) and herpes virus  Still pending as of 09/11/22  Recommendations:   Past Medical History:  Diagnosis Date   Collagen vascular disease (HCC)    Connective tissue disease (HCC)    GERD (gastroesophageal reflux disease)    Hypertension    Lupus (HCC)    Pulmonary hypertension (HCC)    RA (rheumatoid arthritis) (HCC)    Raynaud disease     Past Surgical History:  Procedure Laterality Date   BILATERAL CARPAL TUNNEL RELEASE     BIOPSY  08/14/2022   Procedure: BIOPSY;  Surgeon: Franky Macho, MD;  [facility];  Service: Endoscopy;;   Broken femur rod     CATARACT EXTRACTION     bilateral   COLONOSCOPY N/A 08/29/2015   Procedure: COLONOSCOPY;  Surgeon: [name], MD;  [facility];  Service: Endoscopy;  Laterality: N/A;  1030   ESOPHAGEAL DILATION N/A 02/11/2018   Procedure: ESOPHAGEAL DILATION;  Surgeon: [name], MD;  [facility];  Service: Endoscopy;  Laterality: N/A;   ESOPHAGOGASTRODUODENOSCOPY N/A 10/20/2014   Procedure: ESOPHAGOGASTRODUODENOSCOPY (EGD);  Surgeon: [name], MD;  [facility];  Service: Endoscopy;  Laterality: N/A;  210   ESOPHAGOGASTRODUODENOSCOPY N/A 02/11/2018   Procedure: ESOPHAGOGASTRODUODENOSCOPY (EGD);  Surgeon: [name], MD;  [facility];  Service: Endoscopy;  Laterality: N/A;  10:30   ESOPHAGOGASTRODUODENOSCOPY (EGD) WITH PROPOFOL N/A 08/14/2022   Procedure: ESOPHAGOGASTRODUODENOSCOPY (EGD) WITH PROPOFOL;  Surgeon: Franky Macho, MD;  [facility];  Service: Endoscopy;  Laterality: N/A;  9:30AM;ASA 3, changed to EGD only per PAT   POLYPECTOMY  02/11/2018   Procedure: POLYPECTOMY;  Surgeon: [name], MD;  [facility];  Service: Endoscopy;;  prepyloric(HSx1)    Current  Outpatient Medications  Medication Sig Dispense Refill   alum & mag hydroxide-simeth (MAALOX PLUS) 400-400-40 MG/5ML suspension Take 15 mLs by mouth 3 (three) times daily as needed for indigestion.     Ascorbic Acid (VITAMIN C) 1000 MG tablet Take 1,000 mg by mouth daily. Super C     Azelastine HCl 137 MCG/SPRAY SOLN Place 1 spray into both nostrils 2 (two) times  daily. Use in each nostril as directed     Calcium Carb-Cholecalciferol (CALCIUM 600+D3 PO) Take 1 tablet by mouth in the morning, at noon, and at bedtime.     carboxymethylcellulose (REFRESH PLUS) 0.5 % SOLN Place 1 drop into both eyes as needed (dry eyes).     cyanocobalamin (VITAMIN B12) 1000 MCG tablet Take 1,000 mcg by mouth daily.     cyclobenzaprine (FLEXERIL) 10 MG tablet Take 10 mg by mouth 3 (three) times daily as needed for muscle spasms.     cycloSPORINE (RESTASIS) 0.05 % ophthalmic emulsion Place 1 drop into both eyes 2 (two) times daily.     diltiazem (CARDIZEM) 120 MG tablet Take 120 mg by mouth daily.     docusate sodium (COLACE) 100 MG capsule Take 100 mg by mouth daily as needed for mild constipation.     folic acid (FOLVITE) 1 MG tablet Take 2 mg by mouth daily.     furosemide (LASIX) 20 MG tablet Take 20 mg by mouth daily.     magnesium oxide (MAG-OX) 400 (240 Mg) MG tablet Take 400 mg by mouth daily.     methotrexate (RHEUMATREX) 2.5 MG tablet Take 20 mg by mouth every Monday. Caution:Chemotherapy. Protect from light.     Multiple Vitamin (MULTIVITAMIN WITH MINERALS) TABS tablet Take 1 tablet by mouth daily with lunch.     Omega-3 Fatty Acids (FISH OIL) 1200 MG CAPS Take 1,200 mg by mouth in the morning, at noon, and at bedtime.     oxybutynin (DITROPAN) 5 MG tablet Take 5 mg by mouth daily.     pantoprazole (PROTONIX) 40 MG tablet Take 1 tablet (40 mg total) by mouth daily before breakfast. 60 tablet 2   pantoprazole (PROTONIX) 40 MG tablet Take 1 tablet (40 mg total) by mouth 2 (two) times daily. 60 tablet 2    polyethylene glycol (MIRALAX / GLYCOLAX) 17 g packet Take 17 g by mouth daily as needed for moderate constipation.     pravastatin (PRAVACHOL) 40 MG tablet Take 40 mg by mouth daily with supper.     Probiotic Product (PROBIOTIC DAILY PO) Take 1 capsule by mouth daily.     Sod Picosulfate-Mag Ox-Cit Acd (CLENPIQ) 10-3.5-12 MG-GM -GM/175ML SOLN Take 1 kit by mouth as directed. 350 mL 0   sodium chloride (MURO 128) 2 % ophthalmic solution Place 1 drop into both eyes daily.     sodium chloride (MURO 128) 5 % ophthalmic ointment Place 1 application into both eyes at bedtime.     spironolactone (ALDACTONE) 25 MG tablet Take 25 mg by mouth daily.     Tadalafil, PAH, (ADCIRCA) 20 MG TABS Take 40 mg by mouth daily.     No current facility-administered medications for this visit.    Allergies as of 09/11/2022 - Review Complete 09/11/2022  Allergen Reaction Noted   Daypro [oxaprozin] Other (See Comments) 09/27/2014   Penicillins Hives, Rash, and Other (See Comments) 09/27/2014    No family history on file.  Social History   Socioeconomic History   Marital status: Widowed    Spouse name: Not on file   Number of children: Not on file   Years of education: Not on file   Highest education level: Not on file  Occupational History   Not on file  Tobacco Use   Smoking status: Former    Passive exposure: Past   Smokeless tobacco: Former   Tobacco comments:    quit many years ago  Vaping Use  Vaping status: Never Used  Substance and Sexual Activity   Alcohol use: No    Alcohol/week: 0.0 standard drinks of alcohol   Drug use: No   Sexual activity: Not on file  Other Topics Concern   Not on file  Social History Narrative   Not on file   Social Determinants of Health   Financial Resource Strain: Not on file  Food Insecurity: No Food Insecurity (02/21/2022)   Received from [facility], [facility]   Hunger Vital Sign    Worried About Running Out of  Food in the Last Year: Never true    Ran Out of Food in the Last Year: Never true  Transportation Needs: No Transportation Needs (02/21/2022)   Received from [facility], [facility]   [facility] - Transportation    In the past 12 months, has lack of transportation kept you from medical appointments or from getting medications?: No    Lack of Transportation (Non-Medical): No  Physical Activity: Not on file  Stress: Not on file  Social Connections: Not on file    Review of systems General: negative for malaise, night sweats, fever, chills, weight los Neck: Negative for lumps, goiter, pain and significant neck swelling Resp: Negative for cough, wheezing, dyspnea at rest CV: Negative for chest pain, leg swelling, palpitations, orthopnea GI: denies melena, hematochezia, nausea, vomiting, odyonophagia, early satiety or unintentional weight loss. +GERD +dysphagia +narrow stools +constipation/diarrhea MSK: Negative for joint pain or swelling, back pain, and muscle pain. Derm: Negative for itching or rash Psych: Denies depression, anxiety, memory loss, confusion. No homicidal or suicidal ideation.  Heme: Negative for prolonged bleeding, bruising easily, and swollen nodes. Endocrine: Negative for cold or heat intolerance, polyuria, polydipsia and goiter. Neuro: negative for tremor, gait imbalance, syncope and seizures. The remainder of the review of systems is noncontributory.  Physical Exam: BP 124/68   Pulse 67   Temp 97.8 F (36.6 C)   Ht 5' 2.5" (1.588 m)   Wt 98 lb 1.6 oz (44.5 kg)   BMI 17.66 kg/m  General:   Alert and oriented. No distress noted. Pleasant and cooperative.  Head:  Normocephalic and atraumatic. Eyes:  Conjuctiva clear without scleral icterus. Mouth:  Oral mucosa pink and moist. Good dentition. No lesions. Heart: Normal rate and rhythm, s1 and s2 heart sounds present.  Lungs: Clear lung sounds in all lobes. Respirations equal and  unlabored. Abdomen:  +BS, soft, non-tender and non-distended. No rebound or guarding. No HSM or masses noted. Derm: No palmar erythema or jaundice Msk:  Symmetrical without gross deformities. Normal posture. Extremities:  Without edema. Neurologic:  Alert and  oriented x4 Psych:  Alert and cooperative. Normal mood and affect.  Invalid input(s): "6 MONTHS"   ASSESSMENT: Jessica Escobar is a 72 y.o. female presenting today for follow up of GERD, dysphagia and esophageal/gastric ulcers.   GERD/Dysphagia: Recent EGD with findings of esophageal and gastric ulcer.  Protonix 40 mg was increased to twice daily dosing at that time.  Notes dysphagia has improved some she is doing well and taking small bites, avoiding problematic foods.  She notes GERD feels fairly well-controlled during the day, however she is having GERD most nights after dinner.  She has taken Maalox in the past which has helped with this.  She does eat dinner late but notably is not going to lay down for bed for a few hours after this.  Instruction on good reflux precautions, will continue  with PPI twice daily and can add dose of Maalox as needed prior to bed if needed.  She will need repeat upper endoscopy to evaluate for healing of previously identified ulcers, we will get her scheduled for this in September.   Having a mix of constipation and diarrhea, still with some occasional narrow stools, given last colonoscopy was in 2017 she was advised previously to have colonoscopy for further evaluation of change stool caliber, however pulmonary and anesthesia felt it would not be safe for patient to be under sedation for 2 procedures back-to-back and recommended having colonoscopy separately.  We will plan for potential colonoscopy later near the end of the year.  She denies rectal bleeding or melena and reassuringly her weight is remained stable.   Indications, risks and benefits of procedure discussed in detail with patient. Patient  verbalized understanding and is in agreement to proceed with EGD.   PLAN:  Continue with protonix 40mg  BID  2. Can use maalox PRN prior to bedtime  3. Schedule upper endoscopy-ASA III  4. Schedule Colonoscopy near end of year (recall in October) 5. Chewing precautions 6. Continue with good reflux precautions  All questions were answered, patient verbalized understanding and is in agreement with plan as outlined above.   Follow Up: 3 months   Nafisa Olds L. Jeanmarie Hubert, MSN, APRN, AGNP-C Adult-Gerontology Nurse Practitioner [facility]

## 2022-09-12 NOTE — Progress Notes (Signed)
I reviewed the pathology results. Ann, can you send her a letter with the findings as described below please? Repeat UPPER ENDOSCOPY SCHEDULED 9/26  Thanks,  Vista Lawman, MD Gastroenterology and Hepatology [facility]  ---------------------------------------------------------------------------------------------  [facility] [address], [place], [place] [address] Phone:  [phone]   09/12/22 [name], [place]   Dear Jessica Escobar,  I am writing to inform you that the biopsies taken during your recent endoscopic examination showed:  No H.Pylori bacteria in stomach  and no early signs of cancer ( dysplasia) No eosinophilic esophagitis  Also negative for  cytomegalovirus (CMV) and herpes  simplex virus types 1 and 2 (HSV 1 and 2)   Although you have severe inflammation of food pipe with a ulcer, its important that you take Protonix twice daily , 30 min before breakfast and 30 min before dinner .  Stop using high dose aspirin including Goody/BC powders, NSAIDs such as Aleve, ibuprofen, naproxen, Motrin, Voltaren or Advil (even the topical ones)  You have repeat upper endoscopy scheduled on 9/26 to assess healing   Please call us at [phone] if you have persistent problems or have questions about your condition that have not been fully answered at this time.  Sincerely,  Vista Lawman, MD Gastroenterology and Hepatology

## 2022-09-15 ENCOUNTER — Encounter (INDEPENDENT_AMBULATORY_CARE_PROVIDER_SITE_OTHER): Payer: Self-pay | Admitting: *Deleted

## 2022-09-22 ENCOUNTER — Encounter (INDEPENDENT_AMBULATORY_CARE_PROVIDER_SITE_OTHER): Payer: Self-pay

## 2022-10-21 ENCOUNTER — Encounter (HOSPITAL_COMMUNITY): Payer: Self-pay

## 2022-10-21 ENCOUNTER — Other Ambulatory Visit: Payer: Self-pay

## 2022-10-21 ENCOUNTER — Encounter (HOSPITAL_COMMUNITY)
Admission: RE | Admit: 2022-10-21 | Discharge: 2022-10-21 | Disposition: A | Discharge status: Home / Self Care | Payer: Medicare Other | Source: Ambulatory Visit | Attending: Gastroenterology | Admitting: Gastroenterology

## 2022-10-23 ENCOUNTER — Ambulatory Visit (HOSPITAL_COMMUNITY)
Admission: RE | Admit: 2022-10-23 | Discharge: 2022-10-23 | Disposition: A | Discharge status: Home / Self Care | Payer: Medicare Other | Source: Ambulatory Visit | Attending: Gastroenterology | Admitting: Gastroenterology

## 2022-10-23 ENCOUNTER — Encounter (HOSPITAL_COMMUNITY)
Admission: RE | Disposition: A | Discharge status: Home / Self Care | Payer: Self-pay | Source: Ambulatory Visit | Attending: Gastroenterology

## 2022-10-23 ENCOUNTER — Ambulatory Visit (HOSPITAL_BASED_OUTPATIENT_CLINIC_OR_DEPARTMENT_OTHER): Payer: Medicare Other | Admitting: Certified Registered"

## 2022-10-23 ENCOUNTER — Encounter (HOSPITAL_COMMUNITY): Payer: Self-pay

## 2022-10-23 ENCOUNTER — Ambulatory Visit (HOSPITAL_COMMUNITY): Payer: Medicare Other | Admitting: Certified Registered"

## 2022-10-23 DIAGNOSIS — I10 Essential (primary) hypertension: Secondary | ICD-10-CM | POA: Diagnosis not present

## 2022-10-23 DIAGNOSIS — M329 Systemic lupus erythematosus, unspecified: Secondary | ICD-10-CM | POA: Diagnosis not present

## 2022-10-23 DIAGNOSIS — I272 Pulmonary hypertension, unspecified: Secondary | ICD-10-CM | POA: Insufficient documentation

## 2022-10-23 DIAGNOSIS — Z8711 Personal history of peptic ulcer disease: Secondary | ICD-10-CM | POA: Insufficient documentation

## 2022-10-23 DIAGNOSIS — M069 Rheumatoid arthritis, unspecified: Secondary | ICD-10-CM | POA: Diagnosis not present

## 2022-10-23 DIAGNOSIS — Z87891 Personal history of nicotine dependence: Secondary | ICD-10-CM | POA: Diagnosis not present

## 2022-10-23 DIAGNOSIS — K2210 Ulcer of esophagus without bleeding: Secondary | ICD-10-CM

## 2022-10-23 DIAGNOSIS — K2100 Gastro-esophageal reflux disease with esophagitis, without bleeding: Secondary | ICD-10-CM | POA: Diagnosis not present

## 2022-10-23 DIAGNOSIS — I7300 Raynaud's syndrome without gangrene: Secondary | ICD-10-CM | POA: Insufficient documentation

## 2022-10-23 DIAGNOSIS — R1310 Dysphagia, unspecified: Secondary | ICD-10-CM | POA: Insufficient documentation

## 2022-10-23 DIAGNOSIS — K2950 Unspecified chronic gastritis without bleeding: Secondary | ICD-10-CM | POA: Diagnosis not present

## 2022-10-23 DIAGNOSIS — K2970 Gastritis, unspecified, without bleeding: Secondary | ICD-10-CM

## 2022-10-23 HISTORY — PX: ESOPHAGOGASTRODUODENOSCOPY (EGD) WITH PROPOFOL: SHX5813

## 2022-10-23 HISTORY — PX: BIOPSY: SHX5522

## 2022-10-23 SURGERY — ESOPHAGOGASTRODUODENOSCOPY (EGD) WITH PROPOFOL
Anesthesia: General

## 2022-10-23 MED ORDER — LIDOCAINE HCL (CARDIAC) PF 100 MG/5ML IV SOSY
PREFILLED_SYRINGE | INTRAVENOUS | Status: DC | PRN
  Administered 2022-10-23: 50 mg via INTRAVENOUS

## 2022-10-23 MED ORDER — PROPOFOL 10 MG/ML IV BOLUS
INTRAVENOUS | Status: DC | PRN
  Administered 2022-10-23: 100 mg via INTRAVENOUS

## 2022-10-23 MED ORDER — PROPOFOL 500 MG/50ML IV EMUL
INTRAVENOUS | Status: DC | PRN
  Administered 2022-10-23: 150 ug/kg/min via INTRAVENOUS

## 2022-10-23 MED ORDER — LACTATED RINGERS IV SOLN: INTRAVENOUS | Status: DC

## 2022-10-23 MED ORDER — OMEPRAZOLE 40 MG PO CPDR: 40.0000 mg | DELAYED_RELEASE_CAPSULE | Freq: Two times a day (BID) | ORAL | 5 refills | Status: DC

## 2022-10-23 NOTE — Transfer of Care (Signed)
Immediate Anesthesia Transfer of Care Note  Patient: Jessica Escobar  Procedure(s) Performed: ESOPHAGOGASTRODUODENOSCOPY (EGD) WITH PROPOFOL BIOPSY  Patient Location: Short Stay  Anesthesia Type:General  Level of Consciousness: awake  Airway & Oxygen Therapy: Patient Spontanous Breathing and Patient connected to nasal cannula oxygen  Post-op Assessment: Report given to RN and Post -op Vital signs reviewed and stable  Post vital signs: Reviewed and stable  Last Vitals:  Vitals Value Taken Time  BP 100/46 10/23/22 1135  Temp    Pulse 61 10/23/22 1135  Resp 16 10/23/22 1135  SpO2 100 % 10/23/22 1135    Last Pain:  Vitals:   10/23/22 1135  TempSrc:   PainSc: 0-No pain         Complications: No notable events documented.

## 2022-10-23 NOTE — Anesthesia Procedure Notes (Addendum)
Date/Time: 10/23/2022 11:20 AM  Performed by: Julian Reil, CRNAPre-anesthesia Checklist: Patient identified, Emergency Drugs available, Suction available and Patient being monitored Patient Re-evaluated:Patient Re-evaluated prior to induction Oxygen Delivery Method: Nasal cannula Induction Type: IV induction Placement Confirmation: positive ETCO2

## 2022-10-23 NOTE — Anesthesia Preprocedure Evaluation (Signed)
Anesthesia Evaluation  Patient identified by MRN, date of birth, ID band Patient awake    Reviewed: Allergy & Precautions, H&P , NPO status , Patient's Chart, lab work & pertinent test results, reviewed documented beta blocker date and time   Airway Mallampati: II  TM Distance: >3 FB Neck ROM: full    Dental no notable dental hx.    Pulmonary neg pulmonary ROS, former smoker   Pulmonary exam normal breath sounds clear to auscultation       Cardiovascular Exercise Tolerance: Good hypertension, pulmonary hypertension Rhythm:regular Rate:Normal     Neuro/Psych negative neurological ROS  negative psych ROS   GI/Hepatic negative GI ROS, Neg liver ROS, PUD,GERD  ,,  Endo/Other  negative endocrine ROS    Renal/GU negative Renal ROS  negative genitourinary   Musculoskeletal  (+) Arthritis , Rheumatoid disorders,    Abdominal   Peds  Hematology negative hematology ROS (+) Blood dyscrasia, anemia   Anesthesia Other Findings   Reproductive/Obstetrics negative OB ROS                             Anesthesia Physical Anesthesia Plan  ASA: 3  Anesthesia Plan: General   Post-op Pain Management:    Induction:   PONV Risk Score and Plan: Propofol infusion  Airway Management Planned:   Additional Equipment:   Intra-op Plan:   Post-operative Plan:   Informed Consent: I have reviewed the patients History and Physical, chart, labs and discussed the procedure including the risks, benefits and alternatives for the proposed anesthesia with the patient or authorized representative who has indicated his/her understanding and acceptance.     Dental Advisory Given  Plan Discussed with: CRNA  Anesthesia Plan Comments:        Anesthesia Quick Evaluation

## 2022-10-23 NOTE — Discharge Instructions (Signed)

## 2022-10-23 NOTE — H&P (Signed)
Primary Care Physician:  Renaldo Harrison, DO Primary Gastroenterologist:  Dr. Tasia Catchings  Pre-Procedure History & Physical: HPI: Jessica Escobar is a 72 y.o. female presenting today for follow up of GERD, dysphagia and esophageal/gastric ulcers.     Past Medical History:  Diagnosis Date   Collagen vascular disease (HCC)    Connective tissue disease (HCC)    GERD (gastroesophageal reflux disease)    Hypertension    Lupus (HCC)    Pulmonary hypertension (HCC)    RA (rheumatoid arthritis) (HCC)    Raynaud disease     Past Surgical History:  Procedure Laterality Date   BILATERAL CARPAL TUNNEL RELEASE     BIOPSY  08/14/2022   Procedure: BIOPSY;  Surgeon: Franky Macho, MD;  [facility];  Service: Endoscopy;;   Broken femur rod Right    CATARACT EXTRACTION     bilateral   COLONOSCOPY N/A 08/29/2015   Procedure: COLONOSCOPY;  Surgeon: [name], MD;  [facility]: [facility];  Service: Endoscopy;  Laterality: N/A;  1030   ESOPHAGEAL DILATION N/A 02/11/2018   Procedure: ESOPHAGEAL DILATION;  Surgeon: [name], MD;  [facility]: [facility];  Service: Endoscopy;  Laterality: N/A;   ESOPHAGOGASTRODUODENOSCOPY N/A 10/20/2014   Procedure: ESOPHAGOGASTRODUODENOSCOPY (EGD);  Surgeon: [name], MD;  [facility];  Service: Endoscopy;  Laterality: N/A;  210   ESOPHAGOGASTRODUODENOSCOPY N/A 02/11/2018   Procedure: ESOPHAGOGASTRODUODENOSCOPY (EGD);  Surgeon: [name], MD;  [facility];  Service: Endoscopy;  Laterality: N/A;  10:30   ESOPHAGOGASTRODUODENOSCOPY (EGD) WITH PROPOFOL N/A 08/14/2022   Procedure: ESOPHAGOGASTRODUODENOSCOPY (EGD) WITH PROPOFOL;  Surgeon: Franky Macho, MD;  [facility];  Service: Endoscopy;  Laterality: N/A;  9:30AM;ASA 3, changed to EGD only per PAT   HERNIA REPAIR Right    RIH   POLYPECTOMY  02/11/2018   Procedure: POLYPECTOMY;  Surgeon: [name], MD;  [facility]: [facility];  Service:  Endoscopy;;  prepyloric(HSx1)    Prior to Admission medications   Medication Sig Start Date End Date Taking? Authorizing Provider  alum & mag hydroxide-simeth (MAALOX PLUS) 400-400-40 MG/5ML suspension Take 15 mLs by mouth 3 (three) times daily as needed for indigestion.   Yes [provider]  Ascorbic Acid (VITAMIN C) 1000 MG tablet Take 1,000 mg by mouth daily. Super C   Yes [provider]  Azelastine HCl 137 MCG/SPRAY SOLN Place 1 spray into both nostrils 2 (two) times daily. Use in each nostril as directed   Yes [provider]  Calcium Carb-Cholecalciferol (CALCIUM 600+D3 PO) Take 1 tablet by mouth in the morning, at noon, and at bedtime.   Yes [provider]  carboxymethylcellulose (REFRESH PLUS) 0.5 % SOLN Place 1 drop into both eyes as needed (dry eyes).   Yes [provider]  cyclobenzaprine (FLEXERIL) 10 MG tablet Take 10 mg by mouth 3 (three) times daily as needed for muscle spasms.   Yes [provider]  cycloSPORINE (RESTASIS) 0.05 % ophthalmic emulsion Place 1 drop into both eyes 2 (two) times daily.   Yes [provider]  diltiazem (CARDIZEM) 120 MG tablet Take 120 mg by mouth daily.   Yes [provider]  docusate sodium (COLACE) 100 MG capsule Take 100 mg by mouth daily as needed for mild constipation.   Yes [provider]  folic acid (FOLVITE) 1 MG tablet Take 2 mg by mouth daily.   Yes [provider]  furosemide (LASIX) 20  MG tablet Take 20 mg by mouth daily.   Yes [provider]  methotrexate (RHEUMATREX) 2.5 MG tablet Take 20 mg by mouth every Monday. Caution:Chemotherapy. Protect from light.   Yes [provider]  Multiple Vitamin (MULTIVITAMIN WITH MINERALS) TABS tablet Take 1 tablet by mouth daily with lunch.   Yes [provider]  Omega-3 Fatty Acids (FISH OIL) 1200 MG CAPS Take 1,200 mg by mouth in the morning, at noon, and at bedtime.   Yes [provider]  oxybutynin (DITROPAN) 5 MG tablet Take 5 mg by mouth daily.   Yes [provider]  pantoprazole (PROTONIX) 40 MG tablet Take 1 tablet (40 mg total) by mouth daily before breakfast. 05/22/22  Yes Carlan, Chelsea L, NP  pantoprazole (PROTONIX) 40 MG tablet Take 1 tablet (40 mg total) by mouth 2 (two) times daily. 08/14/22 11/12/22 Yes Dymir Neeson, Juanetta Beets, MD  polyethylene glycol (MIRALAX / GLYCOLAX) 17 g packet Take 17 g by mouth daily as needed for moderate constipation.   Yes [provider]  pravastatin (PRAVACHOL) 40 MG tablet Take 40 mg by mouth daily with supper.   Yes [provider]  Probiotic Product (PROBIOTIC DAILY PO) Take 1 capsule by mouth daily.   Yes [provider]  Sod Picosulfate-Mag Ox-Cit Acd (CLENPIQ) 10-3.5-12 MG-GM -GM/175ML SOLN Take 1 kit by mouth as directed. 08/04/22  Yes Tyese Finken, Juanetta Beets, MD  sodium chloride (MURO 128) 2 % ophthalmic solution Place 1 drop into both eyes daily.   Yes [provider]  sodium chloride (MURO 128) 5 % ophthalmic ointment Place 1 application into both eyes at bedtime.   Yes [provider]  spironolactone (ALDACTONE) 25 MG tablet Take 25 mg by mouth daily.   Yes [provider]  Tadalafil, PAH, (ADCIRCA) 20 MG TABS Take 40 mg by mouth daily.   Yes [provider]  cyanocobalamin (VITAMIN B12) 1000 MCG tablet Take 1,000 mcg by mouth daily.    [provider]  magnesium oxide (MAG-OX) 400 (240 Mg) MG tablet Take 400 mg by mouth daily.    [provider]    Allergies as of 09/11/2022 - Review Complete 09/11/2022  Allergen Reaction Noted   Daypro [oxaprozin] Other (See Comments) 09/27/2014   Penicillins Hives, Rash, and Other (See Comments) 09/27/2014    History reviewed. No pertinent family history.  Social History   Socioeconomic History   Marital status: Widowed    Spouse name: Not on file   Number of children: Not on file   Years of  education: Not on file   Highest education level: Not on file  Occupational History   Not on file  Tobacco Use   Smoking status: Former    Passive exposure: Past   Smokeless tobacco: Former   Tobacco comments:    quit many years ago- last smoked 40+ years ago for 1 year.  Vaping Use   Vaping status: Never Used  Substance and Sexual Activity   Alcohol use: No    Alcohol/week: 0.0 standard drinks of alcohol   Drug use: No   Sexual activity: Not on file  Other Topics Concern   Not on file  Social History Narrative   Not on file   Social Determinants of Health   Financial Resource Strain: Not on file  Food Insecurity: No Food Insecurity (09/19/2022)   [facility]   Hunger Vital Sign    Worried About Running Out of Food in  the Last Year: Never true    Ran Out of Food in the Last Year: Never true  Transportation Needs: No Transportation Needs (09/19/2022)   Received from [facility] - Transportation    In the past 12 months, has lack of transportation kept you from medical appointments or from getting medications?: No    Lack of Transportation (Non-Medical): No  Physical Activity: Not on file  Stress: Not on file  Social Connections: Not on file  Intimate Partner Violence: Not on file    Review of Systems: See HPI, otherwise negative ROS  Physical Exam: Vital signs in last 24 hours: Temp:  [98.1 F (36.7 C)] 98.1 F (36.7 C) (09/26 0945) Pulse Rate:  [60] 60 (09/26 0945) Resp:  [18] 18 (09/26 0945) BP: (112)/(62) 112/62 (09/26 0945) SpO2:  [99 %] 99 % (09/26 0945) Weight:  [45 kg] 45 kg (09/26 0934)   General:   Alert,  Well-developed, well-nourished, pleasant and cooperative in NAD Head:  Normocephalic and atraumatic. Eyes:  Sclera clear, no icterus.   Conjunctiva pink. Ears:  Normal auditory acuity. Nose:  No deformity, discharge,  or lesions. Msk:  Symmetrical without gross deformities. Normal  posture. Extremities:  Without clubbing or edema. Neurologic:  Alert and  oriented x4;  grossly normal neurologically. Skin:  Intact without significant lesions or rashes. Psych:  Alert and cooperative. Normal mood and affect.  Impression/Plan:  Jessica Escobar is a 72 y.o. female presenting today for follow up of GERD, dysphagia and esophageal/gastric ulcers. Proceed with EGD    repeat upper endoscopy to evaluate for healing of previously identified ulcers,   The risks of the procedure including infection, bleed, or perforation as well as benefits, limitations, alternatives and imponderables have been reviewed with the patient. Questions have been answered. All parties agreeable.

## 2022-10-23 NOTE — Op Note (Signed)
[facility] Patient Name: Jessica Escobar Procedure Date: 10/23/2022 11:08 AM MRN: [id number] Date of Birth: [birth year] Attending MD: Sanjuan Dame , MD, [id number] CSN: [id number] Age: 72 Admit Type: Outpatient Procedure:                Upper GI endoscopy Indications:              Dysphagia Providers:                Sanjuan Dame, MD, Elinor Parkinson, Angelica Ran Referring MD:              Medicines:                Monitored Anesthesia Care Complications:            No immediate complications. Estimated Blood Loss:     Estimated blood loss was minimal. Procedure:                Pre-Anesthesia Assessment:                           - Prior to the procedure, a History and Physical                            was performed, and patient medications and                            allergies were reviewed. The patient's tolerance of                            previous anesthesia was also reviewed. The risks                            and benefits of the procedure and the sedation                            options and risks were discussed with the patient.                            All questions were answered, and informed consent                            was obtained. Prior Anticoagulants: The patient has                            taken no anticoagulant or antiplatelet agents. ASA                            Grade Assessment: III - A patient with severe                            systemic disease. After reviewing the risks and                            benefits, the patient was deemed in satisfactory  condition to undergo the procedure.                           After obtaining informed consent, the endoscope was                            passed under direct vision. Throughout the                            procedure, the patient's blood pressure, pulse, and                            oxygen saturations were monitored continuously. The                             GIF-H190 ([id number]) scope was introduced through the                            mouth, and advanced to the second part of duodenum.                            The upper GI endoscopy was accomplished without                            difficulty. The patient tolerated the procedure                            well. Scope In: 11:20:59 AM Scope Out: 11:29:57 AM Total Procedure Duration: 0 hours 8 minutes 58 seconds  Findings:      LA Grade D (one or more mucosal breaks involving at least 75% of       esophageal circumference) esophagitis with no bleeding was found in the       upper and middle thirds of the esophagus. Biopsies were taken with a       cold forceps for histology.      The entire examined stomach was normal.      The duodenal bulb and second portion of the duodenum were normal. Impression:               - LA Grade D reflux esophagitis with no bleeding.                            Biopsied.                           - Normal stomach. Previously seen gastric ulcer                            healed                           -2 cm HH                           - Normal duodenal bulb and second portion of the  duodenum. Moderate Sedation:      Per Anesthesia Care Recommendation:           - Patient has a contact number available for                            emergencies. The signs and symptoms of potential                            delayed complications were discussed with the                            patient. Return to normal activities tomorrow.                            Written discharge instructions were provided to the                            patient.                           - Resume previous diet.                           - Await pathology results.                           - Repeat upper endoscopy for surveillance based on                            pathology results.                           -Given persistent dysphagia and non  healing                            esophagitis despite PPI , patient maty benefit from                            [facility] to r/o underlying motyility disorder                           -Change pantoprazole to Omeprazole BID Procedure Code(s):        --- Professional ---                           [phone], Esophagogastroduodenoscopy, flexible,                            transoral; with biopsy, single or multiple Diagnosis Code(s):        --- Professional ---                           K21.00, Gastro-esophageal reflux disease with                            esophagitis, without bleeding  R13.10, Dysphagia, unspecified CPT copyright 2022 American Medical Association. All rights reserved. The codes documented in this report are preliminary and upon coder review may  be revised to meet current compliance requirements. Sanjuan Dame, MD Sanjuan Dame, MD 10/23/2022 11:40:12 AM This report has been signed electronically. Number of Addenda: 0

## 2022-10-24 LAB — SURGICAL PATHOLOGY

## 2022-10-25 NOTE — Anesthesia Postprocedure Evaluation (Signed)
Anesthesia Post Note  Patient: Jessica Escobar  Procedure(s) Performed: ESOPHAGOGASTRODUODENOSCOPY (EGD) WITH PROPOFOL BIOPSY  Patient location during evaluation: Phase II Anesthesia Type: General Level of consciousness: awake Pain management: pain level controlled Vital Signs Assessment: post-procedure vital signs reviewed and stable Respiratory status: spontaneous breathing and respiratory function stable Cardiovascular status: blood pressure returned to baseline and stable Postop Assessment: no headache and no apparent nausea or vomiting Anesthetic complications: no Comments: Late entry   No notable events documented.   Last Vitals:  Vitals:   10/23/22 0945 10/23/22 1135  BP: 112/62 (!) 100/46  Pulse: 60 61  Resp: 18 16  Temp: 36.7 C   SpO2: 99% 100%    Last Pain:  Vitals:   10/24/22 1129  TempSrc:   PainSc: 0-No pain                 [name]

## 2022-10-27 ENCOUNTER — Other Ambulatory Visit (HOSPITAL_COMMUNITY): Payer: Self-pay | Admitting: Gastroenterology

## 2022-10-27 DIAGNOSIS — K209 Esophagitis, unspecified without bleeding: Secondary | ICD-10-CM

## 2022-10-27 MED ORDER — SUCRALFATE 1 G PO TABS
1.0000 g | ORAL_TABLET | Freq: Three times a day (TID) | ORAL | 1 refills | Status: DC
Start: 2022-10-27 — End: 2022-12-19

## 2022-10-27 NOTE — Progress Notes (Signed)
I reviewed the pathology results. Ann, can you send her a letter with the findings as described below please? Repeat upper endoscopy in 3 months   Thanks,  Vista Lawman, MD Gastroenterology and Hepatology [facility]  ---------------------------------------------------------------------------------------------  [facility] [address], [place], [place] [address] Phone:  [phone]   10/27/22 [name], [place]   Dear Inis Sizer,  I am writing to inform you that the biopsies taken during your recent endoscopic examination showed:  Significant reflux and inflammation of the food pipe.  I recommend :   -Omeprazole 40mg  twice daily -Carafate 4 times daily - Repeat upper endoscopy in 3 months and if the inflammation does not heal at that time will consider HRM to r/o underlying motyility disorder    Please continue to see [place] in the clinic   Please call us at [phone] if you have persistent problems or have questions about your condition that have not been fully answered at this time.  Sincerely,  Vista Lawman, MD Gastroenterology and Hepatology

## 2022-10-28 ENCOUNTER — Encounter (INDEPENDENT_AMBULATORY_CARE_PROVIDER_SITE_OTHER): Payer: Self-pay | Admitting: *Deleted

## 2022-10-31 ENCOUNTER — Encounter (HOSPITAL_COMMUNITY): Payer: Self-pay | Admitting: Gastroenterology

## 2022-11-10 ENCOUNTER — Telehealth (INDEPENDENT_AMBULATORY_CARE_PROVIDER_SITE_OTHER): Payer: Self-pay | Admitting: *Deleted

## 2022-11-10 NOTE — Telephone Encounter (Signed)
Discussed with patient per [facility] -  she can continue to use the topical gel, she should be mindful that you can have some systemic absorption from the topicals as well, though this is less than if she was taking it by mouth. I would advise her to use it as sparingly as she is able to tolerate.  Patient verbalized understanding.

## 2022-11-10 NOTE — Telephone Encounter (Signed)
Pt states she is weaning off of methotrexate. She was reading AVS and saw that it said she should not take voltaren gel. She did not read it til now and has been using it on her shoulders most nights since weaning off of methotrexate. She wants to know if ok to continue. States she needs something for the pain and it helps.   [phone]

## 2022-11-21 ENCOUNTER — Telehealth (INDEPENDENT_AMBULATORY_CARE_PROVIDER_SITE_OTHER): Payer: Self-pay | Admitting: *Deleted

## 2022-11-21 NOTE — Telephone Encounter (Signed)
Patient recall to call to schd TCS in NOv per Leeroy Bock - she wants EGD and TCS a few months apart - see 8/15 note

## 2022-11-21 NOTE — Telephone Encounter (Signed)
Pt contacted and states that she thought provider was going to do another EGD due to seeing ulcers at the top. Please advise. Thank you  (If doing EGD in November, she wants to wait until January to do TCS)

## 2022-11-24 NOTE — Telephone Encounter (Signed)
Patient can have upper endoscopy and colonoscopy in January together, or if she wants the procedure separate would recommend upper endoscopy first in January   For now can you recommend the patient : -Omeprazole 40mg  twice daily

## 2022-11-24 NOTE — Telephone Encounter (Signed)
TCS noted in recall for 01/2023

## 2022-11-24 NOTE — Telephone Encounter (Signed)
Pt contacted. Pt would like to schedule EGD for November and TCS in January. Pt scheduled for 12/04/22 at 12:30pm for EGD. Will call with pre op. Instructions mailed to pt

## 2022-11-25 NOTE — Telephone Encounter (Signed)
Pt left message that 12/04/22 will not work. Pt has been rescheduled to 12/18/22 at 12:30pm. Updated instructions will be sent via mail. Message sent to Endo

## 2022-12-02 ENCOUNTER — Other Ambulatory Visit (HOSPITAL_COMMUNITY): Payer: Medicare Other

## 2022-12-16 ENCOUNTER — Other Ambulatory Visit: Payer: Self-pay

## 2022-12-16 ENCOUNTER — Encounter (HOSPITAL_COMMUNITY): Payer: Self-pay

## 2022-12-16 ENCOUNTER — Ambulatory Visit (INDEPENDENT_AMBULATORY_CARE_PROVIDER_SITE_OTHER): Payer: Medicare Other | Admitting: Gastroenterology

## 2022-12-16 ENCOUNTER — Encounter (HOSPITAL_COMMUNITY)
Admission: RE | Admit: 2022-12-16 | Discharge: 2022-12-16 | Disposition: A | Discharge status: Home / Self Care | Payer: Medicare Other | Source: Ambulatory Visit | Attending: Gastroenterology | Admitting: Gastroenterology

## 2022-12-16 HISTORY — DX: Dependence on supplemental oxygen: Z99.81

## 2022-12-18 ENCOUNTER — Ambulatory Visit (HOSPITAL_COMMUNITY): Payer: Medicare Other | Admitting: Anesthesiology

## 2022-12-18 ENCOUNTER — Ambulatory Visit (HOSPITAL_COMMUNITY)
Admission: RE | Admit: 2022-12-18 | Discharge: 2022-12-18 | Disposition: A | Discharge status: Home / Self Care | Payer: Medicare Other | Attending: Gastroenterology | Admitting: Gastroenterology

## 2022-12-18 ENCOUNTER — Encounter (HOSPITAL_COMMUNITY): Payer: Self-pay

## 2022-12-18 ENCOUNTER — Encounter (HOSPITAL_COMMUNITY)
Admission: RE | Disposition: A | Discharge status: Home / Self Care | Payer: Self-pay | Source: Home / Self Care | Attending: Gastroenterology

## 2022-12-18 DIAGNOSIS — Y9389 Activity, other specified: Secondary | ICD-10-CM | POA: Diagnosis not present

## 2022-12-18 DIAGNOSIS — K2100 Gastro-esophageal reflux disease with esophagitis, without bleeding: Secondary | ICD-10-CM | POA: Insufficient documentation

## 2022-12-18 DIAGNOSIS — W44F3XA Food entering into or through a natural orifice, initial encounter: Secondary | ICD-10-CM | POA: Insufficient documentation

## 2022-12-18 DIAGNOSIS — I10 Essential (primary) hypertension: Secondary | ICD-10-CM | POA: Diagnosis not present

## 2022-12-18 DIAGNOSIS — R1310 Dysphagia, unspecified: Secondary | ICD-10-CM | POA: Diagnosis not present

## 2022-12-18 DIAGNOSIS — K209 Esophagitis, unspecified without bleeding: Secondary | ICD-10-CM | POA: Diagnosis not present

## 2022-12-18 DIAGNOSIS — K259 Gastric ulcer, unspecified as acute or chronic, without hemorrhage or perforation: Secondary | ICD-10-CM

## 2022-12-18 DIAGNOSIS — I272 Pulmonary hypertension, unspecified: Secondary | ICD-10-CM | POA: Diagnosis not present

## 2022-12-18 DIAGNOSIS — Z87891 Personal history of nicotine dependence: Secondary | ICD-10-CM | POA: Diagnosis not present

## 2022-12-18 DIAGNOSIS — T18128A Food in esophagus causing other injury, initial encounter: Secondary | ICD-10-CM | POA: Insufficient documentation

## 2022-12-18 DIAGNOSIS — K449 Diaphragmatic hernia without obstruction or gangrene: Secondary | ICD-10-CM | POA: Insufficient documentation

## 2022-12-18 DIAGNOSIS — Z8711 Personal history of peptic ulcer disease: Secondary | ICD-10-CM | POA: Diagnosis not present

## 2022-12-18 DIAGNOSIS — M069 Rheumatoid arthritis, unspecified: Secondary | ICD-10-CM | POA: Insufficient documentation

## 2022-12-18 DIAGNOSIS — K2210 Ulcer of esophagus without bleeding: Secondary | ICD-10-CM | POA: Insufficient documentation

## 2022-12-18 DIAGNOSIS — K208 Other esophagitis without bleeding: Secondary | ICD-10-CM

## 2022-12-18 DIAGNOSIS — K2950 Unspecified chronic gastritis without bleeding: Secondary | ICD-10-CM | POA: Diagnosis not present

## 2022-12-18 HISTORY — PX: ESOPHAGOGASTRODUODENOSCOPY (EGD) WITH PROPOFOL: SHX5813

## 2022-12-18 HISTORY — PX: BIOPSY: SHX5522

## 2022-12-18 SURGERY — ESOPHAGOGASTRODUODENOSCOPY (EGD) WITH PROPOFOL
Anesthesia: General

## 2022-12-18 MED ORDER — LACTATED RINGERS IV SOLN: INTRAVENOUS | Status: DC

## 2022-12-18 MED ORDER — PROPOFOL 10 MG/ML IV BOLUS
INTRAVENOUS | Status: DC | PRN
  Administered 2022-12-18: 20 mg via INTRAVENOUS
  Administered 2022-12-18: 50 mg via INTRAVENOUS
  Administered 2022-12-18: 20 mg via INTRAVENOUS
  Administered 2022-12-18: 10 mg via INTRAVENOUS

## 2022-12-18 MED ORDER — LIDOCAINE HCL (CARDIAC) PF 100 MG/5ML IV SOSY
PREFILLED_SYRINGE | INTRAVENOUS | Status: DC | PRN
  Administered 2022-12-18: 60 mg via INTRAVENOUS

## 2022-12-18 MED ORDER — LACTATED RINGERS IV SOLN: INTRAVENOUS | Status: DC | PRN

## 2022-12-18 NOTE — Transfer of Care (Signed)
Immediate Anesthesia Transfer of Care Note  Patient: Jessica Escobar  Procedure(s) Performed: ESOPHAGOGASTRODUODENOSCOPY (EGD) WITH PROPOFOL BIOPSY  Patient Location: PACU  Anesthesia Type:General  Level of Consciousness: awake, alert , and patient cooperative  Airway & Oxygen Therapy: Patient Spontanous Breathing and Patient connected to nasal cannula oxygen  Post-op Assessment: Report given to RN, Post -op Vital signs reviewed and stable, and Patient moving all extremities X 4  Post vital signs: Reviewed and stable  Last Vitals:  Vitals Value Taken Time  BP 106/52 12/18/22 1237  Temp 36.6 C 12/18/22 1237  Pulse 67 12/18/22 1237  Resp 16 12/18/22 1237  SpO2 100 % 12/18/22 1237    Last Pain:  Vitals:   12/18/22 1237  TempSrc:   PainSc: 0-No pain      Patients Stated Pain Goal: 4 (12/18/22 1034)  Complications: No notable events documented.

## 2022-12-18 NOTE — Op Note (Signed)
[facility] Patient Name: Jessica Escobar Procedure Date: 12/18/2022 12:17 PM MRN: [id number] Date of Birth: Dec 15, 1950 Attending MD: Sanjuan Dame , MD, [id number] CSN: [id number] Age: 72 Admit Type: Outpatient Procedure:                Upper GI endoscopy Indications:              Follow-up of esophagitis Providers:                Sanjuan Dame, MD, Crystal Page, Zena Amos Referring MD:              Medicines:                Monitored Anesthesia Care Complications:            No immediate complications. Estimated Blood Loss:     Estimated blood loss was minimal. Procedure:                Pre-Anesthesia Assessment:                           - Prior to the procedure, a History and Physical                            was performed, and patient medications and                            allergies were reviewed. The patient's tolerance of                            previous anesthesia was also reviewed. The risks                            and benefits of the procedure and the sedation                            options and risks were discussed with the patient.                            All questions were answered, and informed consent                            was obtained. Prior Anticoagulants: The patient has                            taken no anticoagulant or antiplatelet agents                            except for aspirin. ASA Grade Assessment: III - A                            patient with severe systemic disease. After                            reviewing the risks and benefits, the patient was  deemed in satisfactory condition to undergo the                            procedure.                           After obtaining informed consent, the endoscope was                            passed under direct vision. Throughout the                            procedure, the patient's blood pressure, pulse, and                             oxygen saturations were monitored continuously. The                            GIF-H190 ([id number]) scope was introduced through the                            mouth, and advanced to the second part of duodenum.                            The upper GI endoscopy was accomplished without                            difficulty. The patient tolerated the procedure                            well. Scope In: 12:25:45 PM Scope Out: 12:33:11 PM Total Procedure Duration: 0 hours 7 minutes 26 seconds  Findings:      LA Grade B (one or more mucosal breaks greater than 5 mm, not extending       between the tops of two mucosal folds) esophagitis with no bleeding was       found in the lower third of the esophagus. Biopsies were taken with a       cold forceps for histology.      Food was found in the middle third of the esophagus.      A 2 cm hiatal hernia was present.      The duodenal bulb and second portion of the duodenum were normal. Impression:               - LA Grade B erosive esophagitis with no bleeding.                            Biopsied.                           - Food in the middle third of the esophagus.                           - 2 cm hiatal hernia.                           -  Normal duodenal bulb and second portion of the                            duodenum. Moderate Sedation:      Per Anesthesia Care Recommendation:           - Patient has a contact number available for                            emergencies. The signs and symptoms of potential                            delayed complications were discussed with the                            patient. Return to normal activities tomorrow.                            Written discharge instructions were provided to the                            patient.                           - Resume previous diet.                           - Continue present medications.                           - Await pathology results.                            Improved esophagitis but given food content seen in                            the esophagus , concerning for motility disorder ,                            will refer for [facility] after discussing in clinic Procedure Code(s):        --- Professional ---                           [phone], Esophagogastroduodenoscopy, flexible,                            transoral; with biopsy, single or multiple Diagnosis Code(s):        --- Professional ---                           K20.80, Other esophagitis without bleeding                           T18.128A, Food in esophagus causing other injury,                            initial  encounter                           K44.9, Diaphragmatic hernia without obstruction or                            gangrene CPT copyright 2022 American [employer] Association. All rights reserved. The codes documented in this report are preliminary and upon coder review may  be revised to meet current compliance requirements. Sanjuan Dame, MD Sanjuan Dame, MD 12/18/2022 12:46:20 PM This report has been signed electronically. Number of Addenda: 0

## 2022-12-18 NOTE — Anesthesia Preprocedure Evaluation (Signed)
Anesthesia Evaluation  Patient identified by MRN, date of birth, ID band Patient awake    Reviewed: Allergy & Precautions, H&P , NPO status , Patient's Chart, lab work & pertinent test results, reviewed documented beta blocker date and time   Airway Mallampati: II  TM Distance: >3 FB Neck ROM: full    Dental no notable dental hx.    Pulmonary neg pulmonary ROS, former smoker   Pulmonary exam normal breath sounds clear to auscultation       Cardiovascular Exercise Tolerance: Good hypertension, pulmonary hypertension Rhythm:regular Rate:Normal     Neuro/Psych negative neurological ROS  negative psych ROS   GI/Hepatic negative GI ROS, Neg liver ROS, PUD,GERD  ,,  Endo/Other  negative endocrine ROS    Renal/GU negative Renal ROS  negative genitourinary   Musculoskeletal  (+) Arthritis , Rheumatoid disorders,    Abdominal   Peds  Hematology negative hematology ROS (+) Blood dyscrasia, anemia   Anesthesia Other Findings   Reproductive/Obstetrics negative OB ROS                             Anesthesia Physical Anesthesia Plan  ASA: 3  Anesthesia Plan: General   Post-op Pain Management:    Induction:   PONV Risk Score and Plan: Propofol infusion  Airway Management Planned:   Additional Equipment:   Intra-op Plan:   Post-operative Plan:   Informed Consent: I have reviewed the patients History and Physical, chart, labs and discussed the procedure including the risks, benefits and alternatives for the proposed anesthesia with the patient or authorized representative who has indicated his/her understanding and acceptance.     Dental Advisory Given  Plan Discussed with: CRNA  Anesthesia Plan Comments:        Anesthesia Quick Evaluation

## 2022-12-18 NOTE — Discharge Instructions (Signed)

## 2022-12-18 NOTE — H&P (Signed)
Primary Care Physician:  Renaldo Harrison, DO Primary Gastroenterologist:  Dr. Tasia Catchings  Pre-Procedure History & Physical: HPI: Jessica Escobar is a 72 y.o. female presenting today for follow up of GERD, dysphagia and esophageal/gastric ulcers.    Last egd 09/2024 - LA Grade D reflux esophagitis with no bleeding. Biopsied. - Normal stomach. Previously seen gastric ulcer healed - 2 cm HH - Normal duodenal bulb and second portion of the duodenum.   Past Medical History:  Diagnosis Date   Collagen vascular disease (HCC)    Connective tissue disease (HCC)    GERD (gastroesophageal reflux disease)    Hypertension    Lupus    Oxygen dependent    Pulmonary hypertension (HCC)    RA (rheumatoid arthritis) (HCC)    Raynaud disease     Past Surgical History:  Procedure Laterality Date   BILATERAL CARPAL TUNNEL RELEASE     BIOPSY  08/14/2022   Procedure: BIOPSY;  Surgeon: Franky Macho, MD;  [facility];  Service: Endoscopy;;   BIOPSY  10/23/2022   Procedure: BIOPSY;  Surgeon: Franky Macho, MD;  [facility];  Service: Endoscopy;;   Broken femur rod Right    CATARACT EXTRACTION     bilateral   COLONOSCOPY N/A 08/29/2015   Procedure: COLONOSCOPY;  Surgeon: [name], MD;  [facility];  Service: Endoscopy;  Laterality: N/A;  1030   ESOPHAGEAL DILATION N/A 02/11/2018   Procedure: ESOPHAGEAL DILATION;  Surgeon: [name], MD;  [facility];  Service: Endoscopy;  Laterality: N/A;   ESOPHAGOGASTRODUODENOSCOPY N/A 10/20/2014   Procedure: ESOPHAGOGASTRODUODENOSCOPY (EGD);  Surgeon: [name], MD;  [facility];  Service: Endoscopy;  Laterality: N/A;  210   ESOPHAGOGASTRODUODENOSCOPY N/A 02/11/2018   Procedure: ESOPHAGOGASTRODUODENOSCOPY (EGD);  Surgeon: [name], MD;  [facility];  Service: Endoscopy;  Laterality: N/A;  10:30   ESOPHAGOGASTRODUODENOSCOPY (EGD) WITH PROPOFOL N/A 08/14/2022    Procedure: ESOPHAGOGASTRODUODENOSCOPY (EGD) WITH PROPOFOL;  Surgeon: Franky Macho, MD;  [facility];  Service: Endoscopy;  Laterality: N/A;  9:30AM;ASA 3, changed to EGD only per PAT   ESOPHAGOGASTRODUODENOSCOPY (EGD) WITH PROPOFOL N/A 10/23/2022   Procedure: ESOPHAGOGASTRODUODENOSCOPY (EGD) WITH PROPOFOL;  Surgeon: Franky Macho, MD;  [facility];  Service: Endoscopy;  Laterality: N/A;  11:45am;asa 3   HERNIA REPAIR Right    RIH   POLYPECTOMY  02/11/2018   Procedure: POLYPECTOMY;  Surgeon: [name], MD;  [facility];  Service: Endoscopy;;  prepyloric(HSx1)    Prior to Admission medications   Medication Sig Start Date End Date Taking? Authorizing Provider  alum & mag hydroxide-simeth (MAALOX PLUS) 400-400-40 MG/5ML suspension Take 15 mLs by mouth 3 (three) times daily as needed for indigestion.   Yes [provider]  Ascorbic Acid (VITAMIN C) 1000 MG tablet Take 1,000 mg by mouth daily. Super C   Yes [provider]  Calcium Carb-Cholecalciferol (CALCIUM 600+D3 PO) Take 1 tablet by mouth in the morning, at noon, and at bedtime.   Yes [provider]  carboxymethylcellulose (REFRESH PLUS) 0.5 % SOLN Place 1 drop into both eyes as needed (dry eyes).   Yes [provider]  cyanocobalamin (VITAMIN B12) 1000 MCG tablet Take 1,000 mcg by mouth daily.   Yes [provider]  cyclobenzaprine (FLEXERIL) 10 MG tablet Take 10 mg by mouth 3 (three) times daily as needed for muscle spasms.   Yes [provider]  cycloSPORINE (RESTASIS) 0.05 % ophthalmic emulsion Place 1 drop into both eyes 2 (two) times daily.   Yes [provider]  diltiazem (CARDIZEM) 120 MG tablet Take 120 mg by mouth daily.   Yes [provider]  docusate sodium (COLACE) 100 MG capsule Take 100 mg by mouth daily as needed for mild constipation.   Yes [provider]  folic acid (FOLVITE) 1 MG tablet Take 2 mg  by mouth daily.   Yes [provider]  furosemide (LASIX) 20 MG tablet Take 20 mg by mouth daily.   Yes [provider]  magnesium oxide (MAG-OX) 400 (240 Mg) MG tablet Take 400 mg by mouth daily.   Yes [provider]  methotrexate (RHEUMATREX) 2.5 MG tablet Take 20 mg by mouth every Monday. Caution:Chemotherapy. Protect from light.   Yes [provider]  Multiple Vitamin (MULTIVITAMIN WITH MINERALS) TABS tablet Take 1 tablet by mouth daily with lunch.   Yes [provider]  Omega-3 Fatty Acids (FISH OIL) 1200 MG CAPS Take 1,200 mg by mouth in the morning, at noon, and at bedtime.   Yes [provider]  omeprazole (PRILOSEC) 40 MG capsule Take 1 capsule (40 mg total) by mouth 2 (two) times daily. 10/23/22 04/21/23 Yes Ryelan Kazee, Juanetta Beets, MD  oxybutynin (DITROPAN) 5 MG tablet Take 5 mg by mouth daily.   Yes [provider]  polyethylene glycol (MIRALAX / GLYCOLAX) 17 g packet Take 17 g by mouth daily as needed for moderate constipation.   Yes [provider]  pravastatin (PRAVACHOL) 40 MG tablet Take 40 mg by mouth daily with supper.   Yes [provider]  Probiotic Product (PROBIOTIC DAILY PO) Take 1 capsule by mouth daily.   Yes [provider]  sodium chloride (MURO 128) 2 % ophthalmic solution Place 1 drop into both eyes daily.   Yes [provider]  sodium chloride (MURO 128) 5 % ophthalmic ointment Place 1 application into both eyes at bedtime.   Yes [provider]  spironolactone (ALDACTONE) 25 MG tablet Take 25 mg by mouth daily.   Yes [provider]  sucralfate (CARAFATE) 1 g tablet Take 1 tablet (1 g total) by mouth 4 (four) times daily -  with meals and at bedtime. 10/27/22  Yes Lakindra Wible, Juanetta Beets, MD  Tadalafil, PAH, (ADCIRCA) 20 MG TABS Take 40 mg by mouth daily.   Yes [provider]  Azelastine HCl 137 MCG/SPRAY SOLN Place 1 spray into both nostrils 2 (two) times  daily. Use in each nostril as directed    [provider]  Sod Picosulfate-Mag Ox-Cit Acd (CLENPIQ) 10-3.5-12 MG-GM -GM/175ML SOLN Take 1 kit by mouth as directed. 08/04/22   Franky Macho, MD    Allergies as of 11/24/2022 - Review Complete 10/23/2022  Allergen Reaction Noted   Daypro [oxaprozin] Other (See Comments) 09/27/2014   Penicillins Hives, Rash, and Other (See Comments) 09/27/2014    History reviewed. No pertinent family history.  Social History   Socioeconomic History   Marital status: Widowed    Spouse name: Not on file   Number of children: Not on file   Years of education: Not on file   Highest education level: Not on file  Occupational History   Not on file  Tobacco Use   Smoking status: Former    Passive exposure: Past   Smokeless tobacco: Former   Tobacco comments:    quit many years ago- last smoked 40+ years  ago for 1 year.  Vaping Use   Vaping status: Never Used  Substance and Sexual Activity   Alcohol use: No    Alcohol/week: 0.0 standard drinks of alcohol   Drug use: No   Sexual activity: Not on file  Other Topics Concern   Not on file  Social History Narrative   Not on file   Social Determinants of Health   Financial Resource Strain: Not on file  Food Insecurity: No Food Insecurity (09/19/2022)   Received from [facility]   Hunger Vital Sign    Worried About Running Out of Food in the Last Year: Never true    Ran Out of Food in the Last Year: Never true  Transportation Needs: No Transportation Needs (09/19/2022)   Received from [facility] - Transportation    In the past 12 months, has lack of transportation kept you from medical appointments or from getting medications?: No    Lack of Transportation (Non-Medical): No  Physical Activity: Not on file  Stress: Not on file  Social Connections: Not on file  Intimate Partner Violence: Not on file    Review of Systems: See HPI, otherwise  negative ROS  Physical Exam: Vital signs in last 24 hours: Temp:  [97.9 F (36.6 C)] 97.9 F (36.6 C) (11/21 1034) Pulse Rate:  [65] 65 (11/21 1034) Resp:  [17] 17 (11/21 1034) BP: (134)/(67) 134/67 (11/21 1034) SpO2:  [100 %] 100 % (11/21 1034) Weight:  [44.9 kg] 44.9 kg (11/21 1034)   General:   Alert,  Well-developed, well-nourished, pleasant and cooperative in NAD Head:  Normocephalic and atraumatic. Eyes:  Sclera clear, no icterus.   Conjunctiva pink. Ears:  Normal auditory acuity. Nose:  No deformity, discharge,  or lesions. Msk:  Symmetrical without gross deformities. Normal posture. Extremities:  Without clubbing or edema. Neurologic:  Alert and  oriented x4;  grossly normal neurologically. Skin:  Intact without significant lesions or rashes. Psych:  Alert and cooperative. Normal mood and affect.  Impression/Plan: Jessica Escobar is a 72 y.o. female presenting today for follow up of GERD, dysphagia and esophageal/gastric ulcers. Proceed with EGD to assess healing of LA Class D esophagitis  The risks of the procedure including infection, bleed, or perforation as well as benefits, limitations, alternatives and imponderables have been reviewed with the patient. Questions have been answered. All parties agreeable.

## 2022-12-19 ENCOUNTER — Telehealth (INDEPENDENT_AMBULATORY_CARE_PROVIDER_SITE_OTHER): Payer: Self-pay | Admitting: *Deleted

## 2022-12-19 ENCOUNTER — Other Ambulatory Visit (INDEPENDENT_AMBULATORY_CARE_PROVIDER_SITE_OTHER): Payer: Self-pay | Admitting: Gastroenterology

## 2022-12-19 DIAGNOSIS — K209 Esophagitis, unspecified without bleeding: Secondary | ICD-10-CM

## 2022-12-19 LAB — SURGICAL PATHOLOGY

## 2022-12-19 MED ORDER — SUCRALFATE 1 G PO TABS: 1.0000 g | ORAL_TABLET | Freq: Three times a day (TID) | ORAL | 1 refills | Status: DC

## 2022-12-19 MED ORDER — OMEPRAZOLE 40 MG PO CPDR: 40.0000 mg | DELAYED_RELEASE_CAPSULE | Freq: Two times a day (BID) | ORAL | 5 refills | Status: DC

## 2022-12-19 NOTE — Progress Notes (Signed)
I reviewed the pathology results. Ann, can you send her a letter with the findings as described below please? Repeat upper endoscopy in 1 year  Thanks,  Vista Lawman, MD Gastroenterology and Hepatology [facility]  ---------------------------------------------------------------------------------------------  [facility] [address], [address], [place], [place] [address] Phone:  [phone]   12/19/22 [name], [place]   Dear Inis Sizer,  I am writing to inform you that the biopsies taken during your recent endoscopic examination showed:  A. ESOPHAGUS, BIOPSY:  Acute esophagitis with erosion  Mild chronic, focally active gastritis  Negative for intestinal metaplasia, dysplasia and carcinoma  Negative for viral cytopathic effect and fungal organisms    What does this mean?  The inflammation in your esophagus has significantly improved but it still shows you have active inflammation in the esophagus.  Given food and medications seen in the esophagus despite not eating overnight I am concerned about a motility issue, probably the food pipe is not contracting and moving well.  As discussed , this can be evaluated by manometry for which I will refer you.  Please call us at [phone] if you have persistent problems or have questions about your condition that have not been fully answered at this time.  Sincerely,  Vista Lawman, MD Gastroenterology and Hepatology

## 2022-12-19 NOTE — Addendum Note (Signed)
Addended by: Vista Lawman on: 12/19/2022 10:30 AM   Modules accepted: Orders

## 2022-12-19 NOTE — Telephone Encounter (Signed)
Fax from [facility] requesting refill on sucralafate 1 gram tablet  Patient last seen in office on 09/11/22 by chelsea and had EGD yesterday.

## 2022-12-19 NOTE — Telephone Encounter (Signed)
Hi Jessica Escobar   Can this patient be referred for [facility] ( [place] High resolution Manometry) at [facility]

## 2022-12-19 NOTE — Anesthesia Postprocedure Evaluation (Signed)
Anesthesia Post Note  Patient: Jessica Escobar  Procedure(s) Performed: ESOPHAGOGASTRODUODENOSCOPY (EGD) WITH PROPOFOL BIOPSY  Patient location during evaluation: Phase II Anesthesia Type: General Level of consciousness: awake Pain management: pain level controlled Vital Signs Assessment: post-procedure vital signs reviewed and stable Respiratory status: spontaneous breathing and respiratory function stable Cardiovascular status: blood pressure returned to baseline and stable Postop Assessment: no headache and no apparent nausea or vomiting Anesthetic complications: no Comments: Late entry   No notable events documented.   Last Vitals:  Vitals:   12/18/22 1034 12/18/22 1237  BP: 134/67 (!) 106/52  Pulse: 65 67  Resp: 17 16  Temp: 36.6 C 36.6 C  SpO2: 100% 100%    Last Pain:  Vitals:   12/19/22 1351  TempSrc:   PainSc: 0-No pain                 [name]

## 2022-12-22 NOTE — Telephone Encounter (Signed)
Referral sent, they will contact patient with apt

## 2022-12-23 ENCOUNTER — Encounter (INDEPENDENT_AMBULATORY_CARE_PROVIDER_SITE_OTHER): Payer: Self-pay | Admitting: *Deleted

## 2022-12-24 ENCOUNTER — Encounter (HOSPITAL_COMMUNITY): Payer: Self-pay | Admitting: Gastroenterology

## 2023-01-08 ENCOUNTER — Ambulatory Visit (INDEPENDENT_AMBULATORY_CARE_PROVIDER_SITE_OTHER): Payer: Medicare Other | Admitting: Gastroenterology

## 2023-01-14 ENCOUNTER — Encounter (INDEPENDENT_AMBULATORY_CARE_PROVIDER_SITE_OTHER): Payer: Self-pay | Admitting: *Deleted

## 2023-01-29 ENCOUNTER — Ambulatory Visit (INDEPENDENT_AMBULATORY_CARE_PROVIDER_SITE_OTHER): Payer: Medicare Other | Admitting: Gastroenterology

## 2023-01-29 ENCOUNTER — Encounter (INDEPENDENT_AMBULATORY_CARE_PROVIDER_SITE_OTHER): Payer: Self-pay | Admitting: Gastroenterology

## 2023-01-29 VITALS — BP 127/65 | HR 75 | Temp 97.8°F | Ht 63.0 in | Wt 98.4 lb

## 2023-01-29 DIAGNOSIS — K219 Gastro-esophageal reflux disease without esophagitis: Secondary | ICD-10-CM

## 2023-01-29 DIAGNOSIS — R1310 Dysphagia, unspecified: Secondary | ICD-10-CM

## 2023-01-29 DIAGNOSIS — R1319 Other dysphagia: Secondary | ICD-10-CM

## 2023-01-29 DIAGNOSIS — Z862 Personal history of diseases of the blood and blood-forming organs and certain disorders involving the immune mechanism: Secondary | ICD-10-CM

## 2023-01-29 NOTE — Progress Notes (Signed)
 Katrinka Blazing, M.D. Gastroenterology & Hepatology [facility] [address] [place], [place] [address]  Primary Care Physician: Renaldo Harrison, DO [address] [place] [address]  I will communicate my assessment and recommendations to the referring MD via EMR.  Problems: History of grade D esophagitis History of gastric ulcer  History of Present Illness: Jessica Escobar is a 73 y.o. female with past medical history of  collagen vascular/tissue disorder, GERD, HTN, Lupus, Pulmonary HTN, RA, Raunauds disease, who comes to the clinic for follow-up of GERD, dysphagia, and anemia.  The patient was last seen on 09/11/2022 by Doylene Bode, NP -patient follows with Dr. Tasia Catchings. At that time, the patient was continued on Protonix twice daily and was scheduled for EGD.  EGD was performed on 10/23/2022 which showed grade D esophagitis again and a 2 cm hiatal hernia.  She was changed to omeprazole twice daily.  She underwent a repeat EGD in November with Dr. Tasia Catchings with findings described below.  Patient reports that she is still having some dysphagia 1-2 times a month but is better than in the past. Taking omeprazole twice a day, at least 30 minutes before each meal. No heartburn or odynophagia. Taking Maalox seldom. Still taking sucralfate every 6 hours.   The patient denies having any nausea, vomiting, fever, chills, hematochezia, melena, hematemesis, abdominal distention, abdominal pain, diarrhea, jaundice, pruritus or weight loss.  Barium esophagram: 07/19/21 Age-related esophageal dysmotility. Small sliding hiatal hernia with prominent GE reflux seen during exam to the level of the carina. No evidence of esophageal mass or stricture.   Last EGD: 12/18/2022, grade B esophagitis (biopsies with acute esophagitis with erosion negative for intestinal metaplasia or dysplasia), there was presence of food in the middle third of the esophagus, 2 cm hiatal hernia,  normal stomach and duodenum.  Plan was to refer for high-resolution manometry after discussion in clinic with patient.  Last Colonoscopy:2017 - The entire examined colon is normal. - extrinsic from nodular area anteriorly felt to be cervix. found on digital rectal exam. - No specimens collected.  Past Medical History: Past Medical History:  Diagnosis Date   Collagen vascular disease (HCC)    Connective tissue disease (HCC)    GERD (gastroesophageal reflux disease)    Hypertension    Lupus    Oxygen dependent    Pulmonary hypertension (HCC)    RA (rheumatoid arthritis) (HCC)    Raynaud disease     Past Surgical History: Past Surgical History:  Procedure Laterality Date   BILATERAL CARPAL TUNNEL RELEASE     BIOPSY  08/14/2022   Procedure: BIOPSY;  Surgeon: Franky Macho, MD;  [facility];  Service: Endoscopy;;   BIOPSY  10/23/2022   Procedure: BIOPSY;  Surgeon: Franky Macho, MD;  [facility];  Service: Endoscopy;;   BIOPSY  12/18/2022   Procedure: BIOPSY;  Surgeon: Franky Macho, MD;  [facility];  Service: Endoscopy;;   Broken femur rod Right    CATARACT EXTRACTION     bilateral   COLONOSCOPY N/A 08/29/2015   Procedure: COLONOSCOPY;  Surgeon: [name], MD;  [facility];  Service: Endoscopy;  Laterality: N/A;  1030   ESOPHAGEAL DILATION N/A 02/11/2018   Procedure: ESOPHAGEAL DILATION;  Surgeon: [name], MD;  [facility];  Service: Endoscopy;  Laterality: N/A;   ESOPHAGOGASTRODUODENOSCOPY N/A 10/20/2014   Procedure: ESOPHAGOGASTRODUODENOSCOPY (EGD);  Surgeon: [name], MD;  [facility];  Service: Endoscopy;  Laterality: N/A;  210   ESOPHAGOGASTRODUODENOSCOPY N/A 02/11/2018   Procedure: ESOPHAGOGASTRODUODENOSCOPY (EGD);  Surgeon: [name], MD;  [facility];  Service: Endoscopy;  Laterality: N/A;  10:30   ESOPHAGOGASTRODUODENOSCOPY (EGD) WITH PROPOFOL N/A  08/14/2022   Procedure: ESOPHAGOGASTRODUODENOSCOPY (EGD) WITH PROPOFOL;  Surgeon: Franky Macho, MD;  [facility];  Service: Endoscopy;  Laterality: N/A;  9:30AM;ASA 3, changed to EGD only per PAT   ESOPHAGOGASTRODUODENOSCOPY (EGD) WITH PROPOFOL N/A 10/23/2022   Procedure: ESOPHAGOGASTRODUODENOSCOPY (EGD) WITH PROPOFOL;  Surgeon: Franky Macho, MD;  [facility];  Service: Endoscopy;  Laterality: N/A;  11:45am;asa 3   ESOPHAGOGASTRODUODENOSCOPY (EGD) WITH PROPOFOL N/A 12/18/2022   Procedure: ESOPHAGOGASTRODUODENOSCOPY (EGD) WITH PROPOFOL;  Surgeon: Franky Macho, MD;  [facility];  Service: Endoscopy;  Laterality: N/A;  12:30PM;ASA 3   HERNIA REPAIR Right    RIH   POLYPECTOMY  02/11/2018   Procedure: POLYPECTOMY;  Surgeon: [name], MD;  [facility];  Service: Endoscopy;;  prepyloric(HSx1)    Family History:History reviewed. No pertinent family history.  Social History: Social History   Tobacco Use  Smoking Status Former   Passive exposure: Past  Smokeless Tobacco Former  Tobacco Comments   quit many years ago- last smoked 40+ years ago for 1 year.   Social History   Substance and Sexual Activity  Alcohol Use No   Alcohol/week: 0.0 standard drinks of alcohol   Social History   Substance and Sexual Activity  Drug Use No    Allergies: Allergies  Allergen Reactions   Daypro [Oxaprozin] Other (See Comments)    Upset stomach   Penicillins Hives, Rash and Other (See Comments)    DID THE REACTION INVOLVE: Swelling of the face/tongue/throat, SOB, or low BP? No Sudden or severe rash/hives, skin peeling, or the inside of the mouth or nose? No Did it require medical treatment? No When did it last happen? Long time ago, early 1980s If all above answers are "NO", may proceed with cephalosporin use     Medications: Current Outpatient Medications  Medication Sig Dispense Refill   alum & mag hydroxide-simeth  (MAALOX PLUS) 400-400-40 MG/5ML suspension Take 15 mLs by mouth 3 (three) times daily as needed for indigestion.     Ascorbic Acid (VITAMIN C) 1000 MG tablet Take 1,000 mg by mouth daily. Super C     Azelastine HCl 137 MCG/SPRAY SOLN Place 1 spray into both nostrils 2 (two) times daily. Use in each nostril as directed     Calcium Carb-Cholecalciferol (CALCIUM 600+D3 PO) Take 1 tablet by mouth in the morning, at noon, and at bedtime.     carboxymethylcellulose (REFRESH PLUS) 0.5 % SOLN Place 1 drop into both eyes as needed (dry eyes).     cyanocobalamin (VITAMIN B12) 1000 MCG tablet Take 1,000 mcg by mouth daily.     cyclobenzaprine (FLEXERIL) 10 MG tablet Take 10 mg by mouth 3 (three) times daily as needed for muscle spasms.     cycloSPORINE (RESTASIS) 0.05 % ophthalmic emulsion Place 1 drop into both eyes 2 (two) times daily.     diltiazem (CARDIZEM) 120 MG tablet Take 120 mg by mouth daily.     docusate sodium (COLACE) 100 MG capsule Take 100 mg by mouth daily as needed for mild constipation.     folic acid (FOLVITE) 1 MG tablet Take 2 mg by mouth daily.     furosemide (LASIX) 20 MG  tablet Take 20 mg by mouth daily.     magnesium oxide (MAG-OX) 400 (240 Mg) MG tablet Take 400 mg by mouth daily.     Multiple Vitamin (MULTIVITAMIN WITH MINERALS) TABS tablet Take 1 tablet by mouth daily with lunch.     Omega-3 Fatty Acids (FISH OIL) 1200 MG CAPS Take 1,200 mg by mouth in the morning, at noon, and at bedtime.     omeprazole (PRILOSEC) 40 MG capsule Take 1 capsule (40 mg total) by mouth 2 (two) times daily. 60 capsule 5   oxybutynin (DITROPAN) 5 MG tablet Take 5 mg by mouth daily.     polyethylene glycol (MIRALAX / GLYCOLAX) 17 g packet Take 17 g by mouth daily as needed for moderate constipation.     pravastatin (PRAVACHOL) 40 MG tablet Take 40 mg by mouth daily with supper.     Probiotic Product (PROBIOTIC DAILY PO) Take 1 capsule by mouth daily.     sodium chloride (MURO 128) 2 % ophthalmic  solution Place 1 drop into both eyes daily.     sodium chloride (MURO 128) 5 % ophthalmic ointment Place 1 application into both eyes at bedtime.     spironolactone (ALDACTONE) 25 MG tablet Take 25 mg by mouth daily.     sucralfate (CARAFATE) 1 g tablet TAKE 1 TABLET FOUR TIMES DAILY - WITH MEALS AND AT BEDTIME 240 tablet 5   Tadalafil, PAH, (ADCIRCA) 20 MG TABS Take 40 mg by mouth daily.     No current facility-administered medications for this visit.    Review of Systems: GENERAL: negative for malaise, night sweats HEENT: No changes in hearing or vision, no nose bleeds or other nasal problems. NECK: Negative for lumps, goiter, pain and significant neck swelling RESPIRATORY: Negative for cough, wheezing CARDIOVASCULAR: Negative for chest pain, leg swelling, palpitations, orthopnea GI: SEE HPI MUSCULOSKELETAL: Negative for joint pain or swelling, back pain, and muscle pain. SKIN: Negative for lesions, rash PSYCH: Negative for sleep disturbance, mood disorder and recent psychosocial stressors. HEMATOLOGY Negative for prolonged bleeding, bruising easily, and swollen nodes. ENDOCRINE: Negative for cold or heat intolerance, polyuria, polydipsia and goiter. NEURO: negative for tremor, gait imbalance, syncope and seizures. The remainder of the review of systems is noncontributory.   Physical Exam: BP 127/65 (BP Location: Left Arm, Patient Position: Sitting, Cuff Size: Normal)   Pulse 75   Temp 97.8 F (36.6 C) (Temporal)   Ht 5\' 3"  (1.6 m)   Wt 98 lb 6.4 oz (44.6 kg)   BMI 17.43 kg/m  GENERAL: The patient is AO x3, in no acute distress. Using portable oxygen. HEENT: Head is normocephalic and atraumatic. EOMI are intact. Mouth is well hydrated and without lesions. NECK: Supple. No masses LUNGS: Clear to auscultation. No presence of rhonchi/wheezing/rales. Adequate chest expansion HEART: RRR, normal s1 and s2. ABDOMEN: Soft, nontender, no guarding, no peritoneal signs, and  nondistended. BS +. No masses. EXTREMITIES: Without any cyanosis, clubbing, rash, lesions or edema. NEUROLOGIC: AOx3, no focal motor deficit. SKIN: no jaundice, no rashes  Imaging/Labs: as above  I personally reviewed and interpreted the available labs, imaging and endoscopic files.  Impression and Plan: Jessica Escobar is a 73 y.o. female with past medical history of  collagen vascular/tissue disorder, GERD, HTN, Lupus, Pulmonary HTN, RA, Raunauds disease, who comes to the clinic for follow-up of GERD, dysphagia, and anemia.  The patient has presented some improvement of her dysphagia after taking the PPI although she is still having symptoms of dysphagia intermittently.  She was referred for esophageal manometry but she has not called to [facility] to schedule this yet.  I encouraged her to proceed with this to evaluate the presence of motility abnormalities causing her symptoms.  For now she should continue omeprazole 40 mg twice daily as this has led to some improvement of her GERD based on most recent EGD.  Given her history of anemia, she has been asked to proceed with a colonoscopy.  She is agreeable to proceed with this for now which will be scheduled with her primary gastroenterologist Dr. Tasia Catchings.  -Proceed with scheduling esophageal manometry - patient should call [facility] to schedule this -Continue with omeprazole 40 mg twice a day -Schedule colonoscopy with Dr. Tasia Catchings  All questions were answered.      Katrinka Blazing, MD Gastroenterology and Hepatology [facility]

## 2023-01-29 NOTE — Patient Instructions (Addendum)
 Proceed with scheduling esophageal manometry - patient should call [facility] to schedule this Continue with omeprazole 40 mg twice a day Schedule colonoscopy with Dr. Tasia Catchings

## 2023-01-29 NOTE — H&P (View-Only) (Signed)
Jessica Escobar, M.D. Gastroenterology & Hepatology [facility] [address] [place], [place] [address]  Primary Care Physician: Renaldo Harrison, DO [address] [place] [address]  I will communicate my assessment and recommendations to the referring MD via EMR.  Problems: History of grade D esophagitis History of gastric ulcer  History of Present Illness: Jessica Escobar is a 73 y.o. female with past medical history of  collagen vascular/tissue disorder, GERD, HTN, Lupus, Pulmonary HTN, RA, Jessica Escobar, who comes to the clinic for follow-up of GERD, dysphagia, and anemia.  The patient was last seen on 09/11/2022 by Doylene Bode, NP -patient follows with Dr. Tasia Catchings. At that time, the patient was continued on Protonix twice daily and was scheduled for EGD.  EGD was performed on 10/23/2022 which showed grade D esophagitis again and a 2 cm hiatal hernia.  She was changed to omeprazole twice daily.  She underwent a repeat EGD in November with Dr. Tasia Catchings with findings described below.  Patient reports that she is still having some dysphagia 1-2 times a month but is better than in the past. Taking omeprazole twice a day, at least 30 minutes before each meal. No heartburn or odynophagia. Taking Maalox seldom. Still taking sucralfate every 6 hours.   The patient denies having any nausea, vomiting, fever, chills, hematochezia, melena, hematemesis, abdominal distention, abdominal pain, diarrhea, jaundice, pruritus or weight loss.  Barium esophagram: 07/19/21 Age-related esophageal dysmotility. Small sliding hiatal hernia with prominent GE reflux seen during exam to the level of the carina. No evidence of esophageal mass or stricture.   Last EGD: 12/18/2022, grade B esophagitis (biopsies with acute esophagitis with erosion negative for intestinal metaplasia or dysplasia), there was presence of food in the middle third of the esophagus, 2 cm hiatal hernia,  normal stomach and duodenum.  Plan was to refer for high-resolution manometry after discussion in clinic with patient.  Last Colonoscopy:2017 - The entire examined colon is normal. - extrinsic from nodular area anteriorly felt to be cervix. found on digital rectal exam. - No specimens collected.  Past Medical History: Past Medical History:  Diagnosis Date   Collagen vascular Escobar (HCC)    Connective tissue Escobar (HCC)    GERD (gastroesophageal reflux Escobar)    Hypertension    Lupus    Oxygen dependent    Pulmonary hypertension (HCC)    RA (rheumatoid arthritis) (HCC)    Raynaud Escobar     Past Surgical History: Past Surgical History:  Procedure Laterality Date   BILATERAL CARPAL TUNNEL RELEASE     BIOPSY  08/14/2022   Procedure: BIOPSY;  Surgeon: Franky Macho, MD;  [facility];  Service: Endoscopy;;   BIOPSY  10/23/2022   Procedure: BIOPSY;  Surgeon: Franky Macho, MD;  [facility];  Service: Endoscopy;;   BIOPSY  12/18/2022   Procedure: BIOPSY;  Surgeon: Franky Macho, MD;  [facility];  Service: Endoscopy;;   Broken femur rod Right    CATARACT EXTRACTION     bilateral   COLONOSCOPY N/A 08/29/2015   Procedure: COLONOSCOPY;  Surgeon: [name], MD;  [facility];  Service: Endoscopy;  Laterality: N/A;  1030   ESOPHAGEAL DILATION N/A 02/11/2018   Procedure: ESOPHAGEAL DILATION;  Surgeon: [name], MD;  [facility];  Service: Endoscopy;  Laterality: N/A;   ESOPHAGOGASTRODUODENOSCOPY N/A 10/20/2014   Procedure: ESOPHAGOGASTRODUODENOSCOPY (EGD);  Surgeon: [name], MD;  [facility];  Service: Endoscopy;  Laterality: N/A;  210   ESOPHAGOGASTRODUODENOSCOPY N/A 02/11/2018   Procedure: ESOPHAGOGASTRODUODENOSCOPY (EGD);  Surgeon: [name], MD;  [facility];  Service: Endoscopy;  Laterality: N/A;  10:30   ESOPHAGOGASTRODUODENOSCOPY (EGD) WITH PROPOFOL N/A  08/14/2022   Procedure: ESOPHAGOGASTRODUODENOSCOPY (EGD) WITH PROPOFOL;  Surgeon: Franky Macho, MD;  [facility];  Service: Endoscopy;  Laterality: N/A;  9:30AM;ASA 3, changed to EGD only per PAT   ESOPHAGOGASTRODUODENOSCOPY (EGD) WITH PROPOFOL N/A 10/23/2022   Procedure: ESOPHAGOGASTRODUODENOSCOPY (EGD) WITH PROPOFOL;  Surgeon: Franky Macho, MD;  [facility];  Service: Endoscopy;  Laterality: N/A;  11:45am;asa 3   ESOPHAGOGASTRODUODENOSCOPY (EGD) WITH PROPOFOL N/A 12/18/2022   Procedure: ESOPHAGOGASTRODUODENOSCOPY (EGD) WITH PROPOFOL;  Surgeon: Franky Macho, MD;  [facility];  Service: Endoscopy;  Laterality: N/A;  12:30PM;ASA 3   HERNIA REPAIR Right    RIH   POLYPECTOMY  02/11/2018   Procedure: POLYPECTOMY;  Surgeon: [name], MD;  [facility];  Service: Endoscopy;;  prepyloric(HSx1)    Family History:History reviewed. No pertinent family history.  Social History: Social History   Tobacco Use  Smoking Status Former   Passive exposure: Past  Smokeless Tobacco Former  Tobacco Comments   quit many years ago- last smoked 40+ years ago for 1 year.   Social History   Substance and Sexual Activity  Alcohol Use No   Alcohol/week: 0.0 standard drinks of alcohol   Social History   Substance and Sexual Activity  Drug Use No    Allergies: Allergies  Allergen Reactions   Daypro [Oxaprozin] Other (See Comments)    Upset stomach   Penicillins Hives, Rash and Other (See Comments)    DID THE REACTION INVOLVE: Swelling of the face/tongue/throat, SOB, or low BP? No Sudden or severe rash/hives, skin peeling, or the inside of the mouth or nose? No Did it require medical treatment? No When did it last happen? Long time ago, early 1980s If all above answers are "NO", may proceed with cephalosporin use     Medications: Current Outpatient Medications  Medication Sig Dispense Refill   alum & mag hydroxide-simeth  (MAALOX PLUS) 400-400-40 MG/5ML suspension Take 15 mLs by mouth 3 (three) times daily as needed for indigestion.     Ascorbic Acid (VITAMIN C) 1000 MG tablet Take 1,000 mg by mouth daily. Super C     Azelastine HCl 137 MCG/SPRAY SOLN Place 1 spray into both nostrils 2 (two) times daily. Use in each nostril as directed     Calcium Carb-Cholecalciferol (CALCIUM 600+D3 PO) Take 1 tablet by mouth in the morning, at noon, and at bedtime.     carboxymethylcellulose (REFRESH PLUS) 0.5 % SOLN Place 1 drop into both eyes as needed (dry eyes).     cyanocobalamin (VITAMIN B12) 1000 MCG tablet Take 1,000 mcg by mouth daily.     cyclobenzaprine (FLEXERIL) 10 MG tablet Take 10 mg by mouth 3 (three) times daily as needed for muscle spasms.     cycloSPORINE (RESTASIS) 0.05 % ophthalmic emulsion Place 1 drop into both eyes 2 (two) times daily.     diltiazem (CARDIZEM) 120 MG tablet Take 120 mg by mouth daily.     docusate sodium (COLACE) 100 MG capsule Take 100 mg by mouth daily as needed for mild constipation.     folic acid (FOLVITE) 1 MG tablet Take 2 mg by mouth daily.     furosemide (LASIX) 20 MG  tablet Take 20 mg by mouth daily.     magnesium oxide (MAG-OX) 400 (240 Mg) MG tablet Take 400 mg by mouth daily.     Multiple Vitamin (MULTIVITAMIN WITH MINERALS) TABS tablet Take 1 tablet by mouth daily with lunch.     Omega-3 Fatty Acids (FISH OIL) 1200 MG CAPS Take 1,200 mg by mouth in the morning, at noon, and at bedtime.     omeprazole (PRILOSEC) 40 MG capsule Take 1 capsule (40 mg total) by mouth 2 (two) times daily. 60 capsule 5   oxybutynin (DITROPAN) 5 MG tablet Take 5 mg by mouth daily.     polyethylene glycol (MIRALAX / GLYCOLAX) 17 g packet Take 17 g by mouth daily as needed for moderate constipation.     pravastatin (PRAVACHOL) 40 MG tablet Take 40 mg by mouth daily with supper.     Probiotic Product (PROBIOTIC DAILY PO) Take 1 capsule by mouth daily.     sodium chloride (MURO 128) 2 % ophthalmic  solution Place 1 drop into both eyes daily.     sodium chloride (MURO 128) 5 % ophthalmic ointment Place 1 application into both eyes at bedtime.     spironolactone (ALDACTONE) 25 MG tablet Take 25 mg by mouth daily.     sucralfate (CARAFATE) 1 g tablet TAKE 1 TABLET FOUR TIMES DAILY - WITH MEALS AND AT BEDTIME 240 tablet 5   Tadalafil, PAH, (ADCIRCA) 20 MG TABS Take 40 mg by mouth daily.     No current facility-administered medications for this visit.    Review of Systems: GENERAL: negative for malaise, night sweats HEENT: No changes in hearing or vision, no nose bleeds or other nasal problems. NECK: Negative for lumps, goiter, pain and significant neck swelling RESPIRATORY: Negative for cough, wheezing CARDIOVASCULAR: Negative for chest pain, leg swelling, palpitations, orthopnea GI: SEE HPI MUSCULOSKELETAL: Negative for joint pain or swelling, back pain, and muscle pain. SKIN: Negative for lesions, rash PSYCH: Negative for sleep disturbance, mood disorder and recent psychosocial stressors. HEMATOLOGY Negative for prolonged bleeding, bruising easily, and swollen nodes. ENDOCRINE: Negative for cold or heat intolerance, polyuria, polydipsia and goiter. NEURO: negative for tremor, gait imbalance, syncope and seizures. The remainder of the review of systems is noncontributory.   Physical Exam: BP 127/65 (BP Location: Left Arm, Patient Position: Sitting, Cuff Size: Normal)   Pulse 75   Temp 97.8 F (36.6 C) (Temporal)   Ht 5\' 3"  (1.6 m)   Wt 98 lb 6.4 oz (44.6 kg)   BMI 17.43 kg/m  GENERAL: The patient is AO x3, in no acute distress. Using portable oxygen. HEENT: Head is normocephalic and atraumatic. EOMI are intact. Mouth is well hydrated and without lesions. NECK: Supple. No masses LUNGS: Clear to auscultation. No presence of rhonchi/wheezing/rales. Adequate chest expansion HEART: RRR, normal s1 and s2. ABDOMEN: Soft, nontender, no guarding, no peritoneal signs, and  nondistended. BS +. No masses. EXTREMITIES: Without any cyanosis, clubbing, rash, lesions or edema. NEUROLOGIC: AOx3, no focal motor deficit. SKIN: no jaundice, no rashes  Imaging/Labs: as above  I personally reviewed and interpreted the available labs, imaging and endoscopic files.  Impression and Plan: Shunna Busam is a 73 y.o. female with past medical history of  collagen vascular/tissue disorder, GERD, HTN, Lupus, Pulmonary HTN, RA, Jessica Escobar, who comes to the clinic for follow-up of GERD, dysphagia, and anemia.  The patient has presented some improvement of her dysphagia after taking the PPI although she is still having symptoms of dysphagia intermittently.  She was referred for esophageal manometry but she has not called to [facility] to schedule this yet.  I encouraged her to proceed with this to evaluate the presence of motility abnormalities causing her symptoms.  For now she should continue omeprazole 40 mg twice daily as this has led to some improvement of her GERD based on most recent EGD.  Given her history of anemia, she has been asked to proceed with a colonoscopy.  She is agreeable to proceed with this for now which will be scheduled with her primary gastroenterologist Dr. Tasia Catchings.  -Proceed with scheduling esophageal manometry - patient should call [facility] to schedule this -Continue with omeprazole 40 mg twice a day -Schedule colonoscopy with Dr. Tasia Catchings  All questions were answered.      Jessica Blazing, MD Gastroenterology and Hepatology [facility]

## 2023-02-16 NOTE — Patient Instructions (Signed)
Jessica Escobar  02/16/2023     @PREFPERIOPPHARMACY @   Your procedure is scheduled on  02/19/2023.   Report to [facility] at  0700  A.M.   Call this number if you have problems the morning of surgery:  [phone]  If you experience any cold or flu symptoms such as cough, fever, chills, shortness of breath, etc. between now and your scheduled surgery, please notify us at the above number.   Remember:  Follow the diet and prep instructions given to you by the office.   You may drink clear liquids until  0500  am on 02/19/2023.    Clear liquids allowed are:                    Water, Juice (No red color; non-citric and without pulp; diabetics please choose diet or no sugar options), Carbonated beverages (diabetics please choose diet or no sugar options), Clear Tea (No creamer, milk, or cream, including half & half and powdered creamer), Black Coffee Only (No creamer, milk or cream, including half & half and powdered creamer), and Clear Sports drink (No red color; diabetics please choose diet or no sugar options)     Take these medicines the morning of surgery with A SIP OF WATER                    flexeril (if needed), diltiazem, omeprazole.     Do not wear jewelry, make-up or nail polish, including gel polish,  artificial nails, or any other type of covering on natural nails (fingers and  toes).  Do not wear lotions, powders, or perfumes, or deodorant.  Do not shave 48 hours prior to surgery.  Men may shave face and neck.  Do not bring valuables to the hospital.  [facility] is not responsible for any belongings or valuables.  Contacts, dentures or bridgework may not be worn into surgery.  Leave your suitcase in the car.  After surgery it may be brought to your room.  For patients admitted to the hospital, discharge time will be determined by your treatment team.  Patients discharged the day of surgery will not be allowed to drive home and must have someone with them  for 24 hours.     Special instructions:   DO NOT smoke tobacco or vape for 24 hours before your procedure.  Please read over the following fact sheets that you were given. Anesthesia Post-op Instructions and Care and Recovery After Surgery      Colonoscopy, Adult, Care After The following information offers guidance on how to care for yourself after your procedure. Your health care provider may also give you more specific instructions. If you have problems or questions, contact your health care provider. What can I expect after the procedure? After the procedure, it is common to have: A small amount of blood in your stool for 24 hours after the procedure. Some gas. Mild cramping or bloating of your abdomen. Follow these instructions at home: Eating and drinking  Drink enough fluid to keep your urine pale yellow. Follow instructions from your health care provider about eating or drinking restrictions. Resume your normal diet as told by your health care provider. Avoid heavy or fried foods that are hard to digest. Activity Rest as told by your health care provider. Avoid sitting for a long time without moving. Get up to take short walks every 1-2 hours. This is important to improve blood flow  and breathing. Ask for help if you feel weak or unsteady. Return to your normal activities as told by your health care provider. Ask your health care provider what activities are safe for you. Managing cramping and bloating  Try walking around when you have cramps or feel bloated. If directed, apply heat to your abdomen as told by your health care provider. Use the heat source that your health care provider recommends, such as a moist heat pack or a heating pad. Place a towel between your skin and the heat source. Leave the heat on for 20-30 minutes. Remove the heat if your skin turns bright red. This is especially important if you are unable to feel pain, heat, or cold. You have a greater risk  of getting burned. General instructions If you were given a sedative during the procedure, it can affect you for several hours. Do not drive or operate machinery until your health care provider says that it is safe. For the first 24 hours after the procedure: Do not sign important documents. Do not drink alcohol. Do your regular daily activities at a slower pace than normal. Eat soft foods that are easy to digest. Take over-the-counter and prescription medicines only as told by your health care provider. Keep all follow-up visits. This is important. Contact a health care provider if: You have blood in your stool 2-3 days after the procedure. Get help right away if: You have more than a small spotting of blood in your stool. You have large blood clots in your stool. You have swelling of your abdomen. You have nausea or vomiting. You have a fever. You have increasing pain in your abdomen that is not relieved with medicine. These symptoms may be an emergency. Get help right away. Call 911. Do not wait to see if the symptoms will go away. Do not drive yourself to the hospital. Summary After the procedure, it is common to have a small amount of blood in your stool. You may also have mild cramping and bloating of your abdomen. If you were given a sedative during the procedure, it can affect you for several hours. Do not drive or operate machinery until your health care provider says that it is safe. Get help right away if you have a lot of blood in your stool, nausea or vomiting, a fever, or increased pain in your abdomen. This information is not intended to replace advice given to you by your health care provider. Make sure you discuss any questions you have with your health care provider. Document Revised: 02/25/2022 Document Reviewed: 09/05/2020 Elsevier Patient Education  2024 Else[employer].Monitored Anesthesia Care, Care After The following information offers guidance on how to care for  yourself after your procedure. Your health care provider may also give you more specific instructions. If you have problems or questions, contact your health care provider. What can I expect after the procedure? After the procedure, it is common to have: Tiredness. Little or no memory about what happened during or after the procedure. Impaired judgment when it comes to making decisions. Nausea or vomiting. Some trouble with balance. Follow these instructions at home: For the time period you were told by your health care provider:  Rest. Do not participate in activities where you could fall or become injured. Do not drive or use machinery. Do not drink alcohol. Do not take sleeping pills or medicines that cause drowsiness. Do not make important decisions or sign legal documents. Do not take care of children on  your own. Medicines Take over-the-counter and prescription medicines only as told by your health care provider. If you were prescribed antibiotics, take them as told by your health care provider. Do not stop using the antibiotic even if you start to feel better. Eating and drinking Follow instructions from your health care provider about what you may eat and drink. Drink enough fluid to keep your urine pale yellow. If you vomit: Drink clear fluids slowly and in small amounts as you are able. Clear fluids include water, ice chips, low-calorie sports drinks, and fruit juice that has water added to it (diluted fruit juice). Eat light and bland foods in small amounts as you are able. These foods include bananas, applesauce, rice, lean meats, toast, and crackers. General instructions  Have a responsible adult stay with you for the time you are told. It is important to have someone help care for you until you are awake and alert. If you have sleep apnea, surgery and some medicines can increase your risk for breathing problems. Follow instructions from your health care provider about  wearing your sleep device: When you are sleeping. This includes during daytime naps. While taking prescription pain medicines, sleeping medicines, or medicines that make you drowsy. Do not use any products that contain nicotine or tobacco. These products include cigarettes, chewing tobacco, and vaping devices, such as e-cigarettes. If you need help quitting, ask your health care provider. Contact a health care provider if: You feel nauseous or vomit every time you eat or drink. You feel light-headed. You are still sleepy or having trouble with balance after 24 hours. You get a rash. You have a fever. You have redness or swelling around the IV site. Get help right away if: You have trouble breathing. You have new confusion after you get home. These symptoms may be an emergency. Get help right away. Call 911. Do not wait to see if the symptoms will go away. Do not drive yourself to the hospital. This information is not intended to replace advice given to you by your health care provider. Make sure you discuss any questions you have with your health care provider. Document Revised: 06/10/2021 Document Reviewed: 06/10/2021 Elsevier Patient Education  2024 [employer].

## 2023-02-17 ENCOUNTER — Encounter (HOSPITAL_COMMUNITY)
Admission: RE | Admit: 2023-02-17 | Discharge: 2023-02-17 | Disposition: A | Discharge status: Home / Self Care | Payer: Medicare Other | Source: Ambulatory Visit | Attending: Gastroenterology | Admitting: Gastroenterology

## 2023-02-17 ENCOUNTER — Encounter (HOSPITAL_COMMUNITY): Payer: Self-pay

## 2023-02-17 VITALS — BP 103/39 | HR 70 | Temp 97.5°F | Ht 63.0 in | Wt 100.0 lb

## 2023-02-17 DIAGNOSIS — Z01818 Encounter for other preprocedural examination: Secondary | ICD-10-CM | POA: Diagnosis present

## 2023-02-17 DIAGNOSIS — Z79899 Other long term (current) drug therapy: Secondary | ICD-10-CM | POA: Diagnosis not present

## 2023-02-17 DIAGNOSIS — D649 Anemia, unspecified: Secondary | ICD-10-CM | POA: Insufficient documentation

## 2023-02-17 LAB — CBC WITH DIFFERENTIAL/PLATELET
Abs Immature Granulocytes: 0.01 10*3/uL (ref 0.00–0.07)
Basophils Absolute: 0 10*3/uL (ref 0.0–0.1)
Basophils Relative: 0 %
Eosinophils Absolute: 0 10*3/uL (ref 0.0–0.5)
Eosinophils Relative: 2 %
HCT: 34.1 % — ABNORMAL LOW (ref 36.0–46.0)
Hemoglobin: 10.8 g/dL — ABNORMAL LOW (ref 12.0–15.0)
Immature Granulocytes: 0 %
Lymphocytes Relative: 24 %
Lymphs Abs: 0.6 10*3/uL — ABNORMAL LOW (ref 0.7–4.0)
MCH: 33.5 pg (ref 26.0–34.0)
MCHC: 31.7 g/dL (ref 30.0–36.0)
MCV: 105.9 fL — ABNORMAL HIGH (ref 80.0–100.0)
Monocytes Absolute: 0.3 10*3/uL (ref 0.1–1.0)
Monocytes Relative: 11 %
Neutro Abs: 1.6 10*3/uL — ABNORMAL LOW (ref 1.7–7.7)
Neutrophils Relative %: 63 %
Platelets: 100 10*3/uL — ABNORMAL LOW (ref 150–400)
RBC: 3.22 MIL/uL — ABNORMAL LOW (ref 3.87–5.11)
RDW: 13.5 % (ref 11.5–15.5)
WBC: 2.6 10*3/uL — ABNORMAL LOW (ref 4.0–10.5)
nRBC: 0 % (ref 0.0–0.2)

## 2023-02-17 LAB — BASIC METABOLIC PANEL
Anion gap: 7 (ref 5–15)
BUN: 30 mg/dL — ABNORMAL HIGH (ref 8–23)
CO2: 31 mmol/L (ref 22–32)
Calcium: 9.9 mg/dL (ref 8.9–10.3)
Chloride: 102 mmol/L (ref 98–111)
Creatinine, Ser: 0.75 mg/dL (ref 0.44–1.00)
GFR, Estimated: >60 mL/min (ref >60)
Glucose, Bld: 94 mg/dL (ref 70–99)
Potassium: 4.1 mmol/L (ref 3.5–5.1)
Sodium: 140 mmol/L (ref 135–145)

## 2023-02-19 ENCOUNTER — Encounter (HOSPITAL_COMMUNITY)
Admission: RE | Disposition: A | Discharge status: Home / Self Care | Payer: Self-pay | Source: Ambulatory Visit | Attending: Gastroenterology

## 2023-02-19 ENCOUNTER — Encounter (INDEPENDENT_AMBULATORY_CARE_PROVIDER_SITE_OTHER): Payer: Self-pay | Admitting: *Deleted

## 2023-02-19 ENCOUNTER — Ambulatory Visit (HOSPITAL_COMMUNITY): Payer: Medicare Other | Admitting: Anesthesiology

## 2023-02-19 ENCOUNTER — Ambulatory Visit (HOSPITAL_COMMUNITY)
Admission: RE | Admit: 2023-02-19 | Discharge: 2023-02-19 | Disposition: A | Discharge status: Home / Self Care | Payer: Medicare Other | Source: Ambulatory Visit | Attending: Gastroenterology | Admitting: Gastroenterology

## 2023-02-19 ENCOUNTER — Encounter (HOSPITAL_COMMUNITY): Payer: Self-pay | Admitting: Gastroenterology

## 2023-02-19 DIAGNOSIS — K2100 Gastro-esophageal reflux disease with esophagitis, without bleeding: Secondary | ICD-10-CM | POA: Insufficient documentation

## 2023-02-19 DIAGNOSIS — Z79899 Other long term (current) drug therapy: Secondary | ICD-10-CM | POA: Diagnosis not present

## 2023-02-19 DIAGNOSIS — I272 Pulmonary hypertension, unspecified: Secondary | ICD-10-CM | POA: Insufficient documentation

## 2023-02-19 DIAGNOSIS — D122 Benign neoplasm of ascending colon: Secondary | ICD-10-CM

## 2023-02-19 DIAGNOSIS — K449 Diaphragmatic hernia without obstruction or gangrene: Secondary | ICD-10-CM | POA: Insufficient documentation

## 2023-02-19 DIAGNOSIS — I7300 Raynaud's syndrome without gangrene: Secondary | ICD-10-CM | POA: Diagnosis not present

## 2023-02-19 DIAGNOSIS — I10 Essential (primary) hypertension: Secondary | ICD-10-CM | POA: Insufficient documentation

## 2023-02-19 DIAGNOSIS — Z1211 Encounter for screening for malignant neoplasm of colon: Secondary | ICD-10-CM

## 2023-02-19 DIAGNOSIS — K648 Other hemorrhoids: Secondary | ICD-10-CM | POA: Diagnosis not present

## 2023-02-19 DIAGNOSIS — K644 Residual hemorrhoidal skin tags: Secondary | ICD-10-CM | POA: Diagnosis not present

## 2023-02-19 DIAGNOSIS — R1310 Dysphagia, unspecified: Secondary | ICD-10-CM | POA: Diagnosis not present

## 2023-02-19 DIAGNOSIS — Z87891 Personal history of nicotine dependence: Secondary | ICD-10-CM | POA: Insufficient documentation

## 2023-02-19 DIAGNOSIS — K649 Unspecified hemorrhoids: Secondary | ICD-10-CM | POA: Diagnosis not present

## 2023-02-19 DIAGNOSIS — Z8711 Personal history of peptic ulcer disease: Secondary | ICD-10-CM | POA: Diagnosis not present

## 2023-02-19 HISTORY — PX: COLONOSCOPY WITH PROPOFOL: SHX5780

## 2023-02-19 HISTORY — PX: POLYPECTOMY: SHX5525

## 2023-02-19 LAB — HM COLONOSCOPY

## 2023-02-19 SURGERY — COLONOSCOPY WITH PROPOFOL
Anesthesia: General

## 2023-02-19 MED ORDER — PROPOFOL 1000 MG/100ML IV EMUL
INTRAVENOUS | Status: AC
  Filled 2023-02-19: qty 100

## 2023-02-19 MED ORDER — PROPOFOL 10 MG/ML IV BOLUS
INTRAVENOUS | Status: DC | PRN
  Administered 2023-02-19: 30 mg via INTRAVENOUS

## 2023-02-19 MED ORDER — OMEPRAZOLE 40 MG PO CPDR: 40.0000 mg | DELAYED_RELEASE_CAPSULE | Freq: Every day | ORAL | 5 refills | Status: DC

## 2023-02-19 MED ORDER — PROPOFOL 500 MG/50ML IV EMUL
INTRAVENOUS | Status: DC | PRN
  Administered 2023-02-19: 125 ug/kg/min via INTRAVENOUS

## 2023-02-19 MED ORDER — LACTATED RINGERS IV SOLN: INTRAVENOUS | Status: DC | PRN

## 2023-02-19 MED ORDER — LIDOCAINE HCL (PF) 2 % IJ SOLN
INTRAMUSCULAR | Status: AC
  Filled 2023-02-19: qty 5

## 2023-02-19 NOTE — Interval H&P Note (Signed)
History and Physical Interval Note:  02/19/2023 7:31 AM  Jessica Escobar  has presented today for surgery, with the diagnosis of SCREENING.  The various methods of treatment have been discussed with the patient and family. After consideration of risks, benefits and other options for treatment, the patient has consented to  Procedure(s) with comments: COLONOSCOPY WITH PROPOFOL (N/A) - 9:00AM;ASA 3 as a surgical intervention.  The patient's history has been reviewed, patient examined, no change in status, stable for surgery.  I have reviewed the patient's chart and labs.  Questions were answered to the patient's satisfaction.     [name]

## 2023-02-19 NOTE — Anesthesia Preprocedure Evaluation (Signed)
Anesthesia Evaluation  Patient identified by MRN, date of birth, ID band Patient awake    Reviewed: Allergy & Precautions, H&P , NPO status , Patient's Chart, lab work & pertinent test results, reviewed documented beta blocker date and time   Airway Mallampati: II  TM Distance: >3 FB Neck ROM: full    Dental no notable dental hx.    Pulmonary neg pulmonary ROS, former smoker   Pulmonary exam normal breath sounds clear to auscultation       Cardiovascular Exercise Tolerance: Good hypertension, pulmonary hypertension Rhythm:regular Rate:Normal     Neuro/Psych negative neurological ROS  negative psych ROS   GI/Hepatic negative GI ROS, Neg liver ROS, PUD,GERD  ,,  Endo/Other  negative endocrine ROS    Renal/GU negative Renal ROS  negative genitourinary   Musculoskeletal   Abdominal   Peds  Hematology negative hematology ROS (+) Blood dyscrasia, anemia   Anesthesia Other Findings   Reproductive/Obstetrics negative OB ROS                             Anesthesia Physical Anesthesia Plan  ASA: 3  Anesthesia Plan: General   Post-op Pain Management:    Induction:   PONV Risk Score and Plan: Propofol infusion  Airway Management Planned:   Additional Equipment:   Intra-op Plan:   Post-operative Plan:   Informed Consent: I have reviewed the patients History and Physical, chart, labs and discussed the procedure including the risks, benefits and alternatives for the proposed anesthesia with the patient or authorized representative who has indicated his/her understanding and acceptance.     Dental Advisory Given  Plan Discussed with: CRNA  Anesthesia Plan Comments:        Anesthesia Quick Evaluation

## 2023-02-19 NOTE — Discharge Instructions (Signed)

## 2023-02-19 NOTE — Transfer of Care (Addendum)
Immediate Anesthesia Transfer of Care Note  Patient: Jessica Escobar  Procedure(s) Performed: COLONOSCOPY WITH PROPOFOL POLYPECTOMY  Patient Location: Endoscopy Unit  Anesthesia Type:General  Level of Consciousness: awake and patient cooperative  Airway & Oxygen Therapy: Patient Spontanous Breathing and Patient connected to nasal cannula oxygen  Post-op Assessment: Report given to RN and Post -op Vital signs reviewed and stable  Post vital signs: Reviewed and stable  Last Vitals:  Vitals Value Taken Time  BP 118/55 02/19/23 0859  Temp 35.9 02/22/23   0857  Pulse 62 02/19/23 0859  Resp 20 02/19/23 0900  SpO2 100 % 02/19/23 0859  Vitals shown include unfiled device data.  Last Pain:  Vitals:   02/19/23 0828  TempSrc:   PainSc: 5          Complications: No notable events documented.

## 2023-02-19 NOTE — Op Note (Signed)
[facility] Patient Name: Jessica Escobar Procedure Date: 02/19/2023 8:23 AM MRN: [id number] Date of Birth: May 09, 1950 Attending MD: Sanjuan Dame , MD, [id number] CSN: [id number] Age: 73 Admit Type: Outpatient Procedure:                Colonoscopy Indications:              Screening for colorectal malignant neoplasm Providers:                Sanjuan Dame, MD, Edrick Kins, RN, Pandora Leiter, Technician Referring MD:              Medicines:                 Complications:            No immediate complications. Estimated Blood Loss:     Estimated blood loss: none. Procedure:                Pre-Anesthesia Assessment:                           - Prior to the procedure, a History and Physical                            was performed, and patient medications and                            allergies were reviewed. The patient's tolerance of                            previous anesthesia was also reviewed. The risks                            and benefits of the procedure and the sedation                            options and risks were discussed with the patient.                            All questions were answered, and informed consent                            was obtained. Prior Anticoagulants: The patient has                            taken no anticoagulant or antiplatelet agents                            except for aspirin. ASA Grade Assessment: III - A                            patient with severe systemic disease. After                            reviewing  the risks and benefits, the patient was                            deemed in satisfactory condition to undergo the                            procedure.                           After obtaining informed consent, the colonoscope                            was passed under direct vision. Throughout the                            procedure, the patient's blood pressure, pulse, and                             oxygen saturations were monitored continuously. The                            [id number]) scope was introduced through the                            anus and advanced to the the cecum, identified by                            appendiceal orifice and ileocecal valve. The                            colonoscopy was performed without difficulty. The                            patient tolerated the procedure well. The quality                            of the bowel preparation was evaluated using the                            BBPS [facility] Bowel Preparation Scale) with scores                            of: Right Colon = 3, Transverse Colon = 3 and Left                            Colon = 3 (entire mucosa seen well with no residual                            staining, small fragments of stool or opaque                            liquid). The total BBPS score equals 9. The  ileocecal valve, appendiceal orifice, and rectum                            were photographed. Scope In: 8:34:20 AM Scope Out: 8:54:23 AM Scope Withdrawal Time: 0 hours 13 minutes 13 seconds  Total Procedure Duration: 0 hours 20 minutes 3 seconds  Findings:      A 5 mm polyp was found in the ascending colon. The polyp was sessile.       The polyp was removed with a cold snare. Resection and retrieval were       complete.      Non-bleeding external and internal hemorrhoids were found during       retroflexion. The hemorrhoids were small. Impression:               - One 5 mm polyp in the ascending colon, removed                            with a cold snare. Resected and retrieved.                           - Non-bleeding external and internal hemorrhoids. Moderate Sedation:      Per Anesthesia Care Recommendation:           - Patient has a contact number available for                            emergencies. The signs and symptoms of potential                             delayed complications were discussed with the                            patient. Return to normal activities tomorrow.                            Written discharge instructions were provided to the                            patient.                           - Resume previous diet.                           - Continue present medications.                           - Await pathology results.                           - No repeat colonoscopy due to current age (44                            years or older).                           -Continue PPI given history  of severe esophagitis ,                            consider esophageal manometry given dysphagia and                            history of severe esophagitis ( although much                            improved in previous EGD) Procedure Code(s):        --- Professional ---                           [phone], Colonoscopy, flexible; with removal of                            tumor(s), polyp(s), or other lesion(s) by snare                            technique Diagnosis Code(s):        --- Professional ---                           Z12.11, Encounter for screening for malignant                            neoplasm of colon                           D12.2, Benign neoplasm of ascending colon                           K64.8, Other hemorrhoids CPT copyright 2022 American Medical Association. All rights reserved. The codes documented in this report are preliminary and upon coder review may  be revised to meet current compliance requirements. Sanjuan Dame, MD Sanjuan Dame, MD 02/19/2023 9:02:52 AM This report has been signed electronically. Number of Addenda: 0

## 2023-02-20 ENCOUNTER — Encounter (HOSPITAL_COMMUNITY): Payer: Self-pay | Admitting: Gastroenterology

## 2023-02-20 LAB — SURGICAL PATHOLOGY

## 2023-02-23 NOTE — Anesthesia Postprocedure Evaluation (Signed)
Anesthesia Post Note  Patient: Jessica Escobar  Procedure(s) Performed: COLONOSCOPY WITH PROPOFOL POLYPECTOMY  Patient location during evaluation: Phase II Anesthesia Type: General Level of consciousness: awake Pain management: pain level controlled Vital Signs Assessment: post-procedure vital signs reviewed and stable Respiratory status: spontaneous breathing and respiratory function stable Cardiovascular status: blood pressure returned to baseline and stable Postop Assessment: no headache and no apparent nausea or vomiting Anesthetic complications: no Comments: Late entry   No notable events documented.   Last Vitals:  Vitals:   02/19/23 0722 02/19/23 0857  BP: (!) 155/78 (!) 119/46  Pulse: 67 65  Resp: 14 18  Temp: 36.7 C (!) 35.9 C  SpO2: 100% 100%    Last Pain:  Vitals:   02/20/23 1438  TempSrc:   PainSc: 0-No pain                 [name]

## 2023-02-24 NOTE — Progress Notes (Signed)
I reviewed the pathology results. Ann, can you send her a letter with the findings as described below please?  Thanks,  Vista Lawman, MD Gastroenterology and Hepatology [facility]  ---------------------------------------------------------------------------------------------  [facility] [address], [place], [place] [address] Phone:  [phone]   02/24/23 Jessica Escobar, [place]   Dear Inis Sizer,  I am writing to inform you that the biopsies taken during your recent endoscopic examination showed:  I am writing to let you know the results of your recent colonoscopy.  You had a total of 1 polyps removed. 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 This is good news   Also I value your feedback , so if you get a survey , please take the time to fill it out and thank you for choosing [facility]  Please call us at [phone] if you have persistent problems or have questions about your condition that have not been fully answered at this time.  Sincerely,  Vista Lawman, MD Gastroenterology and Hepatology

## 2023-02-25 ENCOUNTER — Encounter (INDEPENDENT_AMBULATORY_CARE_PROVIDER_SITE_OTHER): Payer: Self-pay | Admitting: *Deleted

## 2023-05-05 ENCOUNTER — Ambulatory Visit (INDEPENDENT_AMBULATORY_CARE_PROVIDER_SITE_OTHER): Payer: Medicare Other | Admitting: Gastroenterology

## 2023-06-11 ENCOUNTER — Ambulatory Visit (INDEPENDENT_AMBULATORY_CARE_PROVIDER_SITE_OTHER): Admitting: Gastroenterology

## 2023-06-15 ENCOUNTER — Encounter (INDEPENDENT_AMBULATORY_CARE_PROVIDER_SITE_OTHER): Payer: Self-pay

## 2023-06-16 ENCOUNTER — Encounter (INDEPENDENT_AMBULATORY_CARE_PROVIDER_SITE_OTHER): Payer: Self-pay | Admitting: Gastroenterology

## 2023-06-16 ENCOUNTER — Ambulatory Visit (INDEPENDENT_AMBULATORY_CARE_PROVIDER_SITE_OTHER): Admitting: Gastroenterology

## 2023-06-16 VITALS — BP 116/67 | HR 78 | Temp 97.8°F | Ht 62.0 in | Wt 103.5 lb

## 2023-06-16 DIAGNOSIS — K219 Gastro-esophageal reflux disease without esophagitis: Secondary | ICD-10-CM

## 2023-06-16 DIAGNOSIS — R1319 Other dysphagia: Secondary | ICD-10-CM

## 2023-06-16 DIAGNOSIS — D539 Nutritional anemia, unspecified: Secondary | ICD-10-CM | POA: Diagnosis not present

## 2023-06-16 NOTE — Progress Notes (Signed)
 Referring Provider: Jacqualin Mate, DO Primary Care Physician:  Jacqualin Mate, DO Primary GI Physician: Dr. Alita Irwin   Chief Complaint  Patient presents with   Follow-up    Patient here today for a follow up on Gerd. Patient says she is taking omeprazole  40 mg once per day, she says this is not covering her symptoms as at night she is having to take Maalox.   HPI:  Jessica Escobar is a 73 y.o. female with past medical history of collagen vascular/tissue disorder, GERD, HTN, Lupus, Pulmonary HTN, RA, Raunauds disease   Patient presenting today for:  Follow up of GERD/dysphagia and IDA   Last seen January 2025, by Dr. Sammi Crick, at that time reported dysphag 1-2 times per month but better than in the past, taking omeprazole  BID, maalox seldomly. Carafate  every 6 hours.   Recommended to schedule esophageal manometry, continue Omeprazole  BID, schedule colonoscopy with Dr. Alita Irwin  Present:  Feels that GERD is better though only taking omeprazole  once daily as she states she was told at last visit to do PPI once daily(last OV recommended to continue PPI BID, but recent Rx sent for only daily).  She states carafate  was stopped. She does maalox a few nights per week for breakthrough. She is taking carafate  only once per day as this was all she had left. Swallowing feels improved, no real issues at this time. She did not ever go to [facility] for manometry but is not interestedin pursuing as she feels dysphagia has resolved. No nausea or vomiting. Appetite is good. Has gained a few pounds. No rectal bleeding, melena, constipation or diarrhea.    Last Colonoscopy: 01/2023 - One 5 mm polyp in the ascending colon, removed                            with a cold snare. Resected and retrieved. (Tubular adenoma)                            - Non-bleeding external and internal hemorrhoids. Barium esophagram: 07/19/21 Age-related esophageal dysmotility. Small sliding hiatal hernia with prominent GE reflux seen  during exam to the level of the carina. No evidence of esophageal mass or stricture.   Last EGD: 12/18/2022, grade B esophagitis (biopsies with acute esophagitis with erosion negative for intestinal metaplasia or dysplasia), there was presence of food in the middle third of the esophagus, 2 cm hiatal hernia, normal stomach and duodenum.  Plan was to refer for high-resolution manometry after discussion in clinic with patient.   Past Medical History:  Diagnosis Date   Collagen vascular disease (HCC)    Connective tissue disease (HCC)    GERD (gastroesophageal reflux disease)    Hypertension    Lupus    Oxygen dependent    Pulmonary hypertension (HCC)    RA (rheumatoid arthritis) (HCC)    Raynaud disease     Past Surgical History:  Procedure Laterality Date   BILATERAL CARPAL TUNNEL RELEASE     BIOPSY  08/14/2022   Procedure: BIOPSY;  Surgeon: Hargis Lias, MD;  [facility];  Service: Endoscopy;;   BIOPSY  10/23/2022   Procedure: BIOPSY;  Surgeon: Hargis Lias, MD;  [facility];  Service: Endoscopy;;   BIOPSY  12/18/2022   Procedure: BIOPSY;  Surgeon: Hargis Lias, MD;  [facility];  Service: Endoscopy;;   Broken femur rod Right  CATARACT EXTRACTION     bilateral   COLONOSCOPY N/A 08/29/2015   Procedure: COLONOSCOPY;  Surgeon: Ruby Corporal, MD;  [facility];  Service: Endoscopy;  Laterality: N/A;  1030   COLONOSCOPY WITH PROPOFOL  N/A 02/19/2023   Procedure: COLONOSCOPY WITH PROPOFOL ;  Surgeon: Hargis Lias, MD;  [facility];  Service: Endoscopy;  Laterality: N/A;  9:00AM;ASA 3   ESOPHAGEAL DILATION N/A 02/11/2018   Procedure: ESOPHAGEAL DILATION;  Surgeon: Ruby Corporal, MD;  [facility];  Service: Endoscopy;  Laterality: N/A;   ESOPHAGOGASTRODUODENOSCOPY N/A 10/20/2014   Procedure: ESOPHAGOGASTRODUODENOSCOPY (EGD);  Surgeon: Ruby Corporal, MD;  [facility];  Service:  Endoscopy;  Laterality: N/A;  210   ESOPHAGOGASTRODUODENOSCOPY N/A 02/11/2018   Procedure: ESOPHAGOGASTRODUODENOSCOPY (EGD);  Surgeon: Ruby Corporal, MD;  [facility];  Service: Endoscopy;  Laterality: N/A;  10:30   ESOPHAGOGASTRODUODENOSCOPY (EGD) WITH PROPOFOL  N/A 08/14/2022   Procedure: ESOPHAGOGASTRODUODENOSCOPY (EGD) WITH PROPOFOL ;  Surgeon: Hargis Lias, MD;  [facility];  Service: Endoscopy;  Laterality: N/A;  9:30AM;ASA 3, changed to EGD only per PAT   ESOPHAGOGASTRODUODENOSCOPY (EGD) WITH PROPOFOL  N/A 10/23/2022   Procedure: ESOPHAGOGASTRODUODENOSCOPY (EGD) WITH PROPOFOL ;  Surgeon: Hargis Lias, MD;  [facility];  Service: Endoscopy;  Laterality: N/A;  11:45am;asa 3   ESOPHAGOGASTRODUODENOSCOPY (EGD) WITH PROPOFOL  N/A 12/18/2022   Procedure: ESOPHAGOGASTRODUODENOSCOPY (EGD) WITH PROPOFOL ;  Surgeon: Hargis Lias, MD;  [facility];  Service: Endoscopy;  Laterality: N/A;  12:30PM;ASA 3   HERNIA REPAIR Right    RIH   POLYPECTOMY  02/11/2018   Procedure: POLYPECTOMY;  Surgeon: Ruby Corporal, MD;  [facility];  Service: Endoscopy;;  prepyloric(HSx1)   POLYPECTOMY  02/19/2023   Procedure: POLYPECTOMY;  Surgeon: Hargis Lias, MD;  [facility];  Service: Endoscopy;;    Current Outpatient Medications  Medication Sig Dispense Refill   alum & mag hydroxide-simeth (MAALOX PLUS) 400-400-40 MG/5ML suspension Take 15 mLs by mouth 3 (three) times daily as needed for indigestion.     Ascorbic Acid (VITAMIN C) 1000 MG tablet Take 1,000 mg by mouth daily. Super C     Azelastine HCl 137 MCG/SPRAY SOLN Place 1 spray into both nostrils 2 (two) times daily. Use in each nostril as directed     Calcium Carb-Cholecalciferol (CALCIUM 600+D3 PO) Take 1 tablet by mouth in the morning, at noon, and at bedtime.     carboxymethylcellulose (REFRESH PLUS) 0.5 % SOLN Place 1 drop into both eyes as needed (dry eyes).      cyanocobalamin (VITAMIN B12) 1000 MCG tablet Take 1,000 mcg by mouth daily.     cyclobenzaprine (FLEXERIL) 10 MG tablet Take 10 mg by mouth 3 (three) times daily as needed for muscle spasms.     cycloSPORINE (RESTASIS) 0.05 % ophthalmic emulsion Place 1 drop into both eyes 2 (two) times daily.     diltiazem (CARDIZEM) 120 MG tablet Take 120 mg by mouth daily.     docusate sodium (COLACE) 100 MG capsule Take 100 mg by mouth daily as needed for mild constipation.     folic acid (FOLVITE) 1 MG tablet Take 2 mg by mouth daily.     furosemide (LASIX) 20 MG tablet Take 20 mg by mouth daily.     magnesium oxide (MAG-OX) 400 (240 Mg) MG tablet Take 400 mg by mouth daily.     Multiple Vitamin (MULTIVITAMIN WITH MINERALS) TABS tablet  Take 1 tablet by mouth daily with lunch.     Omega-3 Fatty Acids (FISH OIL) 1200 MG CAPS Take 1,200 mg by mouth in the morning, at noon, and at bedtime.     omeprazole  (PRILOSEC) 40 MG capsule Take 1 capsule (40 mg total) by mouth daily. 30 capsule 5   oxybutynin (DITROPAN) 5 MG tablet Take 5 mg by mouth daily.     polyethylene glycol (MIRALAX / GLYCOLAX) 17 g packet Take 17 g by mouth daily as needed for moderate constipation.     pravastatin (PRAVACHOL) 40 MG tablet Take 40 mg by mouth daily with supper.     Probiotic Product (PROBIOTIC DAILY PO) Take 1 capsule by mouth daily.     sodium chloride  (MURO 128) 2 % ophthalmic solution Place 1 drop into both eyes daily.     sodium chloride  (MURO 128) 5 % ophthalmic ointment Place 1 application into both eyes at bedtime.     spironolactone (ALDACTONE) 25 MG tablet Take 25 mg by mouth daily.     Tadalafil, PAH, (ADCIRCA) 20 MG TABS Take 40 mg by mouth daily.     No current facility-administered medications for this visit.    Allergies as of 06/16/2023 - Review Complete 02/19/2023  Allergen Reaction Noted   Daypro [oxaprozin] Other (See Comments) 09/27/2014   Penicillins Hives, Rash, and Other (See Comments) 09/27/2014     Social History   Socioeconomic History   Marital status: Widowed    Spouse name: Not on file   Number of children: Not on file   Years of education: Not on file   Highest education level: Not on file  Occupational History   Not on file  Tobacco Use   Smoking status: Former    Passive exposure: Past   Smokeless tobacco: Former   Tobacco comments:    quit many years ago- last smoked 40+ years ago for 1 year.  Vaping Use   Vaping status: Never Used  Substance and Sexual Activity   Alcohol use: No    Alcohol/week: 0.0 standard drinks of alcohol   Drug use: No   Sexual activity: Not on file  Other Topics Concern   Not on file  Social History Narrative   Not on file   Social Drivers of Health   Financial Resource Strain: Not on file  Food Insecurity: No Food Insecurity (09/19/2022)   Received from [facility]   Hunger Vital Sign    Ran Out of Food in the Last Year: Never true    Worried About Running Out of Food in the Last Year: Never true  Transportation Needs: No Transportation Needs (09/19/2022)   Received from [facility]   PRAPARE - Transportation    Lack of Transportation (Non-Medical): No    In the past 12 months, has lack of transportation kept you from medical appointments or from getting medications?: No  Physical Activity: Not on file  Stress: Not on file  Social Connections: Not on file    Review of systems General: negative for malaise, night sweats, fever, chills, weight loss Neck: Negative for lumps, goiter, pain and significant neck swelling Resp: Negative for cough, wheezing, dyspnea at rest CV: Negative for chest pain, leg swelling, palpitations, orthopnea GI: denies melena, hematochezia, nausea, vomiting, diarrhea, constipation, dysphagia, odyonophagia, early satiety or unintentional weight loss. +reflux  The remainder of the review of systems is noncontributory.  Physical Exam: There were no vitals taken for  this visit. General:  Alert and oriented. No distress noted. Pleasant and cooperative. On supplemental O2  Head:  Normocephalic and atraumatic. Eyes:  Conjuctiva clear without scleral icterus. Mouth:  Oral mucosa pink and moist. Good dentition. No lesions. Heart: Normal rate and rhythm, s1 and s2 heart sounds present.  Lungs: Crackles in lung bases  Abdomen:  +BS, soft, non-tender and non-distended. No rebound or guarding. No HSM or masses noted. Neurologic:  Alert and  oriented x4 Psych:  Alert and cooperative. Normal mood and affect.  Invalid input(s): "6 MONTHS"   ASSESSMENT: Jessica Escobar is a 73 y.o. female presenting today for follow up of GERD and dysphagia  GERD:  better, though seems it was much improved on BID dosing of PPI. Having to use maalox a few nights per week for breakthrough. Dysphagia has resolved, therefore she does not wish to pursue esophageal manometry at this time. She will make me aware if dysphagia becomes an issue again. At this time, recommend stepping back up to PPI BID dosing as symptoms were better managed on this.   Macrocytic anemia: recent EGD and Colonoscopy as outlined above. Grade B esophagitis with erosions possibly contributing to anemia. She has history of B12 deficiency and is on supplementation. Last hgb 10.9 which is baseline for her. iron studies in June 2023 were WNL. No rectal bleeding or melena. No indications for further endoscopic evaluations at this time.    PLAN:  Resume BID dosing of omeprazole  40mg   Esophageal manometry if dysphagia recurs Good reflux precautions   All questions were answered, patient verbalized understanding and is in agreement with plan as outlined above.    Follow Up: 6 months   Hunner Garcon L. Riyan Haile, MSN, APRN, AGNP-C Adult-Gerontology Nurse Practitioner [facility]

## 2023-06-16 NOTE — Patient Instructions (Signed)
 Let's resume twice daily dosing of omeprazole  40mg  as symptoms were better controlled on this Take first dose 30 minutes prior to breakfast and second dose 30 minutes prior to dinner If this is working well, let me know and I will update the prescription for twice a day If swallowing becomes an issue again, we can refer you back to [facility] for esophageal manometry as previously recommended Be mindful of greasy, spicy, fried, citrus foods, caffeine, carbonated drinks, chocolate and alcohol as these can increase reflux symptoms Stay upright 2-3 hours after eating, prior to lying down and avoid eating late in the evenings.  Follow up 6 months  It was a pleasure to see you today. I want to create trusting relationships with patients and provide genuine, compassionate, and quality care. I truly value your feedback! please be on the lookout for a survey regarding your visit with me today. I appreciate your input about our visit and your time in completing this!    Shereena Berquist L. Keeana Pieratt, MSN, APRN, AGNP-C Adult-Gerontology Nurse Practitioner [facility] at [facility]

## 2023-11-11 ENCOUNTER — Encounter (INDEPENDENT_AMBULATORY_CARE_PROVIDER_SITE_OTHER): Payer: Self-pay | Admitting: Gastroenterology

## 2023-11-12 ENCOUNTER — Encounter (INDEPENDENT_AMBULATORY_CARE_PROVIDER_SITE_OTHER): Payer: Self-pay | Admitting: *Deleted

## 2023-12-22 ENCOUNTER — Encounter (INDEPENDENT_AMBULATORY_CARE_PROVIDER_SITE_OTHER): Payer: Self-pay

## 2023-12-22 ENCOUNTER — Other Ambulatory Visit (INDEPENDENT_AMBULATORY_CARE_PROVIDER_SITE_OTHER): Payer: Self-pay

## 2023-12-22 MED ORDER — OMEPRAZOLE 40 MG PO CPDR: DELAYED_RELEASE_CAPSULE | ORAL | 1 refills | Status: DC

## 2023-12-28 ENCOUNTER — Telehealth (INDEPENDENT_AMBULATORY_CARE_PROVIDER_SITE_OTHER): Payer: Self-pay

## 2023-12-28 ENCOUNTER — Ambulatory Visit (INDEPENDENT_AMBULATORY_CARE_PROVIDER_SITE_OTHER): Admitting: Gastroenterology

## 2023-12-28 VITALS — BP 94/50 | HR 72 | Temp 97.9°F | Ht 62.0 in | Wt 113.2 lb

## 2023-12-28 DIAGNOSIS — K209 Esophagitis, unspecified without bleeding: Secondary | ICD-10-CM | POA: Diagnosis not present

## 2023-12-28 DIAGNOSIS — K219 Gastro-esophageal reflux disease without esophagitis: Secondary | ICD-10-CM

## 2023-12-28 DIAGNOSIS — K2970 Gastritis, unspecified, without bleeding: Secondary | ICD-10-CM

## 2023-12-28 MED ORDER — ESOMEPRAZOLE MAGNESIUM 40 MG PO CPDR: 40.0000 mg | DELAYED_RELEASE_CAPSULE | Freq: Two times a day (BID) | ORAL | 1 refills | Status: DC

## 2023-12-28 NOTE — Telephone Encounter (Signed)
 Patient needs repeat EGD with Dr. Cinderella for esophagitis and chronic gastritis, ASA 3. Cannot schedule patient until January schedule is out. Will call then.

## 2023-12-28 NOTE — Patient Instructions (Signed)
-  stop omeprazole  40mg   -start nexium 40mg  twice daily, let me know how you do on this after a few weeks -good reflux precautions to include being mindful of greasy, spicy, fried, citrus foods, caffeine, carbonated drinks, chocolate and alcohol as these can increase reflux symptoms Stay upright 2-3 hours after eating, prior to lying down and avoid eating late in the evenings. -schedule EGD   Follow up 6 months  It was a pleasure to see you today. I want to create trusting relationships with patients and provide genuine, compassionate, and quality care. I truly value your feedback! please be on the lookout for a survey regarding your visit with me today. I appreciate your input about our visit and your time in completing this!    Everley Evora L. Shamera Yarberry, MSN, APRN, AGNP-C Adult-Gerontology Nurse Practitioner [facility] at [facility]

## 2023-12-28 NOTE — Progress Notes (Signed)
 Referring Provider: Joella Sieving, DO Primary Care Physician:  Joella Sieving, DO Primary GI Physician: Dr. Cinderella   Chief Complaint  Patient presents with   Follow-up    Pt says taking 40 mg of Omeprazole   and still has to take maalox at night    HPI:   Jessica Escobar is a 73 y.o. female with past medical history of collagen vascular/tissue disorder, GERD, HTN, Lupus, Pulmonary HTN, RA, Raunauds disease   Patient presenting today for:  Follow up of GERD, gastritis   Last seen may, at that time, GERD better on omeprazole  daily, doing carafate  once daily, doing maalox a few nights per week, no real dysphagia. Not interested in pursuing esophageal manometry.   Recommended resume BID dosing of omeprazole , consider esophageal manometry if dysphagia recurs, good reflux precautions  Present:  Feels symptoms are better on omeprazole  40mg  BID. She is taking carafate  once per day but not sure this is providing any relief for her or not. She notes some heartburn and takes maalox a few times per week for this. She does endorse eating late but goes to bed a few hours after this. No nausea or vomiting. Feels appetite is improving, she has gained about 7 pounds since may (3 pound oxygen was on the scale). Tries to eat slow but not having issues with dysphagia currently. She does endorse a lot of spicy/tomato based foods and drinks 1 cup of caffeinated coffee but does this early in the day.  She has some right sided abdominal discomfort when laying down, noting she has to raise her leg to alleviate this, she has been told this was sciatic nerve pain in the past. She inquires about trying nexium as she used this in the past and had good results.   No red flag symptoms. Patient denies melena, hematochezia, nausea, vomiting, diarrhea, constipation, dysphagia, odyonophagia, early satiety or weight loss.    Last Colonoscopy:01/2023 - One 5 mm polyp in the ascending colon, removed                             with a cold snare. Resected and retrieved. (Tubular adenoma)                            - Non-bleeding external and internal hemorrhoids. Barium esophagram: 07/19/21 Age-related esophageal dysmotility. Small sliding hiatal hernia with prominent GE reflux seen during exam to the level of the carina. No evidence of esophageal mass or stricture.   Last EGD: 12/18/2022, grade B esophagitis (biopsies with acute esophagitis with erosion negative for intestinal metaplasia or dysplasia), there was presence of food in the middle third of the esophagus, 2 cm hiatal hernia, normal stomach and duodenum.  Plan was to refer for high-resolution manometry after discussion in clinic with patient   Filed Weights   12/28/23 1044  Weight: 113 lb 3.2 oz (51.3 kg)     Past Medical History:  Diagnosis Date   Collagen vascular disease    Connective tissue disease    GERD (gastroesophageal reflux disease)    Hypertension    Lupus    Oxygen dependent    Pulmonary hypertension (HCC)    RA (rheumatoid arthritis) (HCC)    Raynaud disease     Past Surgical History:  Procedure Laterality Date   BILATERAL CARPAL TUNNEL RELEASE     BIOPSY  08/14/2022   Procedure: BIOPSY;  Surgeon: Cinderella Grass  F, MD;  [facility]: [facility];  Service: Endoscopy;;   BIOPSY  10/23/2022   Procedure: BIOPSY;  Surgeon: Cinderella Deatrice FALCON, MD;  [facility]: [facility];  Service: Endoscopy;;   BIOPSY  12/18/2022   Procedure: BIOPSY;  Surgeon: Cinderella Deatrice FALCON, MD;  [facility]: [facility];  Service: Endoscopy;;   Broken femur rod Right    CATARACT EXTRACTION     bilateral   COLONOSCOPY N/A 08/29/2015   Procedure: COLONOSCOPY;  Surgeon: Claudis RAYMOND Rivet, MD;  [facility]: [facility];  Service: Endoscopy;  Laterality: N/A;  1030   COLONOSCOPY WITH PROPOFOL  N/A 02/19/2023   Procedure: COLONOSCOPY WITH PROPOFOL ;  Surgeon: Cinderella Deatrice FALCON, MD;  [facility]: [facility];  Service: Endoscopy;  Laterality: N/A;  9:00AM;ASA 3   ESOPHAGEAL  DILATION N/A 02/11/2018   Procedure: ESOPHAGEAL DILATION;  Surgeon: Rivet Claudis RAYMOND, MD;  [facility]: [facility];  Service: Endoscopy;  Laterality: N/A;   ESOPHAGOGASTRODUODENOSCOPY N/A 10/20/2014   Procedure: ESOPHAGOGASTRODUODENOSCOPY (EGD);  Surgeon: Claudis RAYMOND Rivet, MD;  [facility]: [facility];  Service: Endoscopy;  Laterality: N/A;  210   ESOPHAGOGASTRODUODENOSCOPY N/A 02/11/2018   Procedure: ESOPHAGOGASTRODUODENOSCOPY (EGD);  Surgeon: Rivet Claudis RAYMOND, MD;  [facility]: [facility];  Service: Endoscopy;  Laterality: N/A;  10:30   ESOPHAGOGASTRODUODENOSCOPY (EGD) WITH PROPOFOL  N/A 08/14/2022   Procedure: ESOPHAGOGASTRODUODENOSCOPY (EGD) WITH PROPOFOL ;  Surgeon: Cinderella Deatrice FALCON, MD;  [facility]: [facility];  Service: Endoscopy;  Laterality: N/A;  9:30AM;ASA 3, changed to EGD only per PAT   ESOPHAGOGASTRODUODENOSCOPY (EGD) WITH PROPOFOL  N/A 10/23/2022   Procedure: ESOPHAGOGASTRODUODENOSCOPY (EGD) WITH PROPOFOL ;  Surgeon: Cinderella Deatrice FALCON, MD;  [facility];  Service: Endoscopy;  Laterality: N/A;  11:45am;asa 3   ESOPHAGOGASTRODUODENOSCOPY (EGD) WITH PROPOFOL  N/A 12/18/2022   Procedure: ESOPHAGOGASTRODUODENOSCOPY (EGD) WITH PROPOFOL ;  Surgeon: Cinderella Deatrice FALCON, MD;  [facility];  Service: Endoscopy;  Laterality: N/A;  12:30PM;ASA 3   HERNIA REPAIR Right    RIH   POLYPECTOMY  02/11/2018   Procedure: POLYPECTOMY;  Surgeon: Rivet Claudis RAYMOND, MD;  [facility]: [facility];  Service: Endoscopy;;  prepyloric(HSx1)   POLYPECTOMY  02/19/2023   Procedure: POLYPECTOMY;  Surgeon: Cinderella Deatrice FALCON, MD;  [facility]: [facility];  Service: Endoscopy;;    Current Outpatient Medications  Medication Sig Dispense Refill   alum & mag hydroxide-simeth (MAALOX PLUS) 400-400-40 MG/5ML suspension Take 15 mLs by mouth 3 (three) times daily as needed for indigestion. (Patient taking differently: Take 15 mLs by mouth daily as needed for indigestion. At night prn)     Ascorbic Acid (VITAMIN  C) 1000 MG tablet Take 1,000 mg by mouth daily. Super C     Azelastine HCl 137 MCG/SPRAY SOLN Place 1 spray into both nostrils 2 (two) times daily. Use in each nostril as directed     Calcium Carb-Cholecalciferol (CALCIUM 600+D3 PO) Take 1 tablet by mouth in the morning, at noon, and at bedtime.     carboxymethylcellulose (REFRESH PLUS) 0.5 % SOLN Place 1 drop into both eyes as needed (dry eyes).     cyanocobalamin (VITAMIN B12) 1000 MCG tablet Take 1,000 mcg by mouth daily.     cyclobenzaprine (FLEXERIL) 10 MG tablet Take 10 mg by mouth 3 (three) times daily as needed for muscle spasms.     cycloSPORINE (RESTASIS) 0.05 % ophthalmic emulsion Place 1 drop into both eyes 2 (two) times daily.     diltiazem (CARDIZEM) 120 MG tablet Take 120 mg by mouth daily.  docusate sodium (COLACE) 100 MG capsule Take 100 mg by mouth daily as needed for mild constipation.     folic acid (FOLVITE) 1 MG tablet Take 2 mg by mouth daily.     furosemide (LASIX) 20 MG tablet Take 20 mg by mouth daily.     magnesium oxide (MAG-OX) 400 (240 Mg) MG tablet Take 400 mg by mouth daily.     Multiple Vitamin (MULTIVITAMIN WITH MINERALS) TABS tablet Take 1 tablet by mouth daily with lunch.     Omega-3 Fatty Acids (FISH OIL) 1200 MG CAPS Take 1,200 mg by mouth in the morning, at noon, and at bedtime.     omeprazole  (PRILOSEC) 40 MG capsule Take one tablet BID 180 capsule 1   oxybutynin (DITROPAN) 5 MG tablet Take 5 mg by mouth daily.     polyethylene glycol (MIRALAX / GLYCOLAX) 17 g packet Take 17 g by mouth daily as needed for moderate constipation.     pravastatin (PRAVACHOL) 40 MG tablet Take 40 mg by mouth daily with supper.     Probiotic Product (PROBIOTIC DAILY PO) Take 1 capsule by mouth daily.     sodium chloride  (MURO 128) 2 % ophthalmic solution Place 1 drop into both eyes daily.     sodium chloride  (MURO 128) 5 % ophthalmic ointment Place 1 application into both eyes at bedtime.     spironolactone (ALDACTONE) 25 MG  tablet Take 25 mg by mouth daily.     Tadalafil, PAH, (ADCIRCA) 20 MG TABS Take 40 mg by mouth daily.     No current facility-administered medications for this visit.    Allergies as of 12/28/2023 - Review Complete 12/28/2023  Allergen Reaction Noted   Daypro [oxaprozin] Other (See Comments) 09/27/2014   Penicillins Hives, Rash, and Other (See Comments) 09/27/2014    Social History   Socioeconomic History   Marital status: Widowed    Spouse name: Not on file   Number of children: Not on file   Years of education: Not on file   Highest education level: Not on file  Occupational History   Not on file  Tobacco Use   Smoking status: Former    Passive exposure: Past   Smokeless tobacco: Former   Tobacco comments:    quit many years ago- last smoked 40+ years ago for 1 year.  Vaping Use   Vaping status: Never Used  Substance and Sexual Activity   Alcohol use: No    Alcohol/week: 0.0 standard drinks of alcohol   Drug use: No   Sexual activity: Not on file  Other Topics Concern   Not on file  Social History Narrative   Not on file   Social Drivers of Health   Financial Resource Strain: Not on file  Food Insecurity: No Food Insecurity (09/19/2022)   Received from [facility]   Hunger Vital Sign    Within the past 12 months, the food you bought just didn't last and you didn't have money to get more.: Never true    Within the past 12 months, you worried that your food would run out before you got the money to buy more.: Never true  Transportation Needs: No Transportation Needs (09/19/2022)   Received from [facility]   PRAPARE - Transportation    Lack of Transportation (Non-Medical): No    In the past 12 months, has lack of transportation kept you from medical appointments or from getting medications?: No  Physical Activity: Not on file  Stress: Not on file  Social Connections: Not on file    Review of systems General: negative for  malaise, night sweats, fever, chills, weight loss Neck: Negative for lumps, goiter, pain and significant neck swelling Resp: Negative for cough, wheezing, dyspnea at rest CV: Negative for chest pain, leg swelling, palpitations, orthopnea GI: denies melena, hematochezia, nausea, vomiting, diarrhea, constipation, dysphagia, odyonophagia, early satiety or unintentional weight loss. +heartburn MSK: Negative for joint pain or swelling, back pain, and muscle pain. Derm: Negative for itching or rash Psych: Denies depression, anxiety, memory loss, confusion. No homicidal or suicidal ideation.  Heme: Negative for prolonged bleeding, bruising easily, and swollen nodes. Endocrine: Negative for cold or heat intolerance, polyuria, polydipsia and goiter. Neuro: negative for tremor, gait imbalance, syncope and seizures. The remainder of the review of systems is noncontributory.  Physical Exam: BP (!) 94/50   Pulse 72   Temp 97.9 F (36.6 C) (Temporal)   Ht 5' 2 (1.575 m)   Wt 113 lb 3.2 oz (51.3 kg) Comment: wt with O2 box  BMI 20.70 kg/m  General:   Alert and oriented. No distress noted. Pleasant and cooperative.  Head:  Normocephalic and atraumatic. Eyes:  Conjuctiva clear without scleral icterus. Mouth:  Oral mucosa pink and moist. Good dentition. No lesions. Heart: Normal rate and rhythm, s1 and s2 heart sounds present.  Lungs: Clear lung sounds in all lobes. Respirations equal and unlabored. Abdomen:  +BS, soft, non-tender and non-distended. No rebound or guarding. No HSM or masses noted. Derm: No palmar erythema or jaundice Msk:  Symmetrical without gross deformities. Normal posture. Extremities:  Without edema. Neurologic:  Alert and  oriented x4 Psych:  Alert and cooperative. Normal mood and affect.  Invalid input(s): 6 MONTHS   ASSESSMENT: Kailana Forni is a 73 y.o. female presenting today for follow up of GERD  Feels symptoms have improved some on BID dosing of omeprazole   though still having heartburn requiring use of maalox a few nights per week. She denies dysphagia at this time. Appetite and weight have improved. She inquired about trying nexium as she had good results with this in the past. I think this is reasonable given she is still having breakthrough on omeprazole . She has declined Ph impdence/esophageal manometry testing in the past. Will stop omeprazole  and start nexium 40mg  BID. She should let me know how she is doing on this in 2-3 weeks. Encouraged to implement good reflux precautions.   Last EGD in November 2024 with chronic active gastritis and esophagitis, recommended to repeat EGD in 1 year. We will get her scheduled for this. Indications, risks and benefits of procedure discussed in detail with patient. Patient verbalized understanding and is in agreement to proceed with EGD   PLAN:  -stop omeprazole  40mg   -start nexium 40mg  BID -good reflux precautions -schedule EGD ASA III  All questions were answered, patient verbalized understanding and is in agreement with plan as outlined above.   Follow Up: 6 months   Aadyn Buchheit L. Gionni Freese, MSN, APRN, AGNP-C Adult-Gerontology Nurse Practitioner [facility]

## 2023-12-29 NOTE — Telephone Encounter (Signed)
 Spoke with patient, she stated that she would like to be scheduled for her repeat EGD in March.

## 2023-12-30 ENCOUNTER — Other Ambulatory Visit (INDEPENDENT_AMBULATORY_CARE_PROVIDER_SITE_OTHER): Payer: Self-pay | Admitting: Gastroenterology

## 2024-02-24 ENCOUNTER — Other Ambulatory Visit (INDEPENDENT_AMBULATORY_CARE_PROVIDER_SITE_OTHER): Payer: Self-pay | Admitting: Gastroenterology
